# Patient Record
Sex: Male | Born: 1985 | Race: White | Hispanic: No | Marital: Single | Smoking: Never smoker
Health system: Southern US, Community
[De-identification: ages and names within clinical notes are randomized; demographics above are authoritative.]

## PROBLEM LIST (undated history)

## (undated) DIAGNOSIS — F4010 Social phobia, unspecified: Secondary | ICD-10-CM

## (undated) DIAGNOSIS — F4310 Post-traumatic stress disorder, unspecified: Secondary | ICD-10-CM

## (undated) DIAGNOSIS — R030 Elevated blood-pressure reading, without diagnosis of hypertension: Secondary | ICD-10-CM

## (undated) DIAGNOSIS — F410 Panic disorder [episodic paroxysmal anxiety] without agoraphobia: Secondary | ICD-10-CM

## (undated) HISTORY — DX: Panic disorder (episodic paroxysmal anxiety): F41.0

## (undated) HISTORY — DX: Post-traumatic stress disorder, unspecified: F43.10

## (undated) HISTORY — DX: Social phobia, unspecified: F40.10

## (undated) HISTORY — PX: OTHER SURGICAL HISTORY: SHX169

## (undated) HISTORY — DX: Elevated blood-pressure reading, without diagnosis of hypertension: R03.0

---

## 2009-06-15 ENCOUNTER — Emergency Department: Payer: Self-pay | Admitting: Emergency Medicine

## 2009-10-14 ENCOUNTER — Emergency Department: Payer: Self-pay | Admitting: Emergency Medicine

## 2010-07-29 ENCOUNTER — Emergency Department: Payer: Self-pay | Admitting: Emergency Medicine

## 2011-01-20 ENCOUNTER — Emergency Department: Payer: Self-pay | Admitting: Emergency Medicine

## 2011-10-09 IMAGING — CR LEFT LITTLE FINGER 2+V
1 series · 3 of 3 positions shown · non-contrast
Comparison: none

REASON FOR EXAM: trauma, pain, swelling
COMMENTS:   May transport without cardiac monitor

PROCEDURE:     DXR - DXR FINGER PINKY 5TH DIGIT LT HA  - January 20, 2011  [DATE]
RESULT:     There is a fracture of the posterior aspect of the base of the
middle phalanx of the left fifth digit. The fracture extends into the joint
space. No other abnormalities are identified.

[Series 1: view not recorded · 0.17mm/px · 3 of 3 slices shown]
[im 1/3]
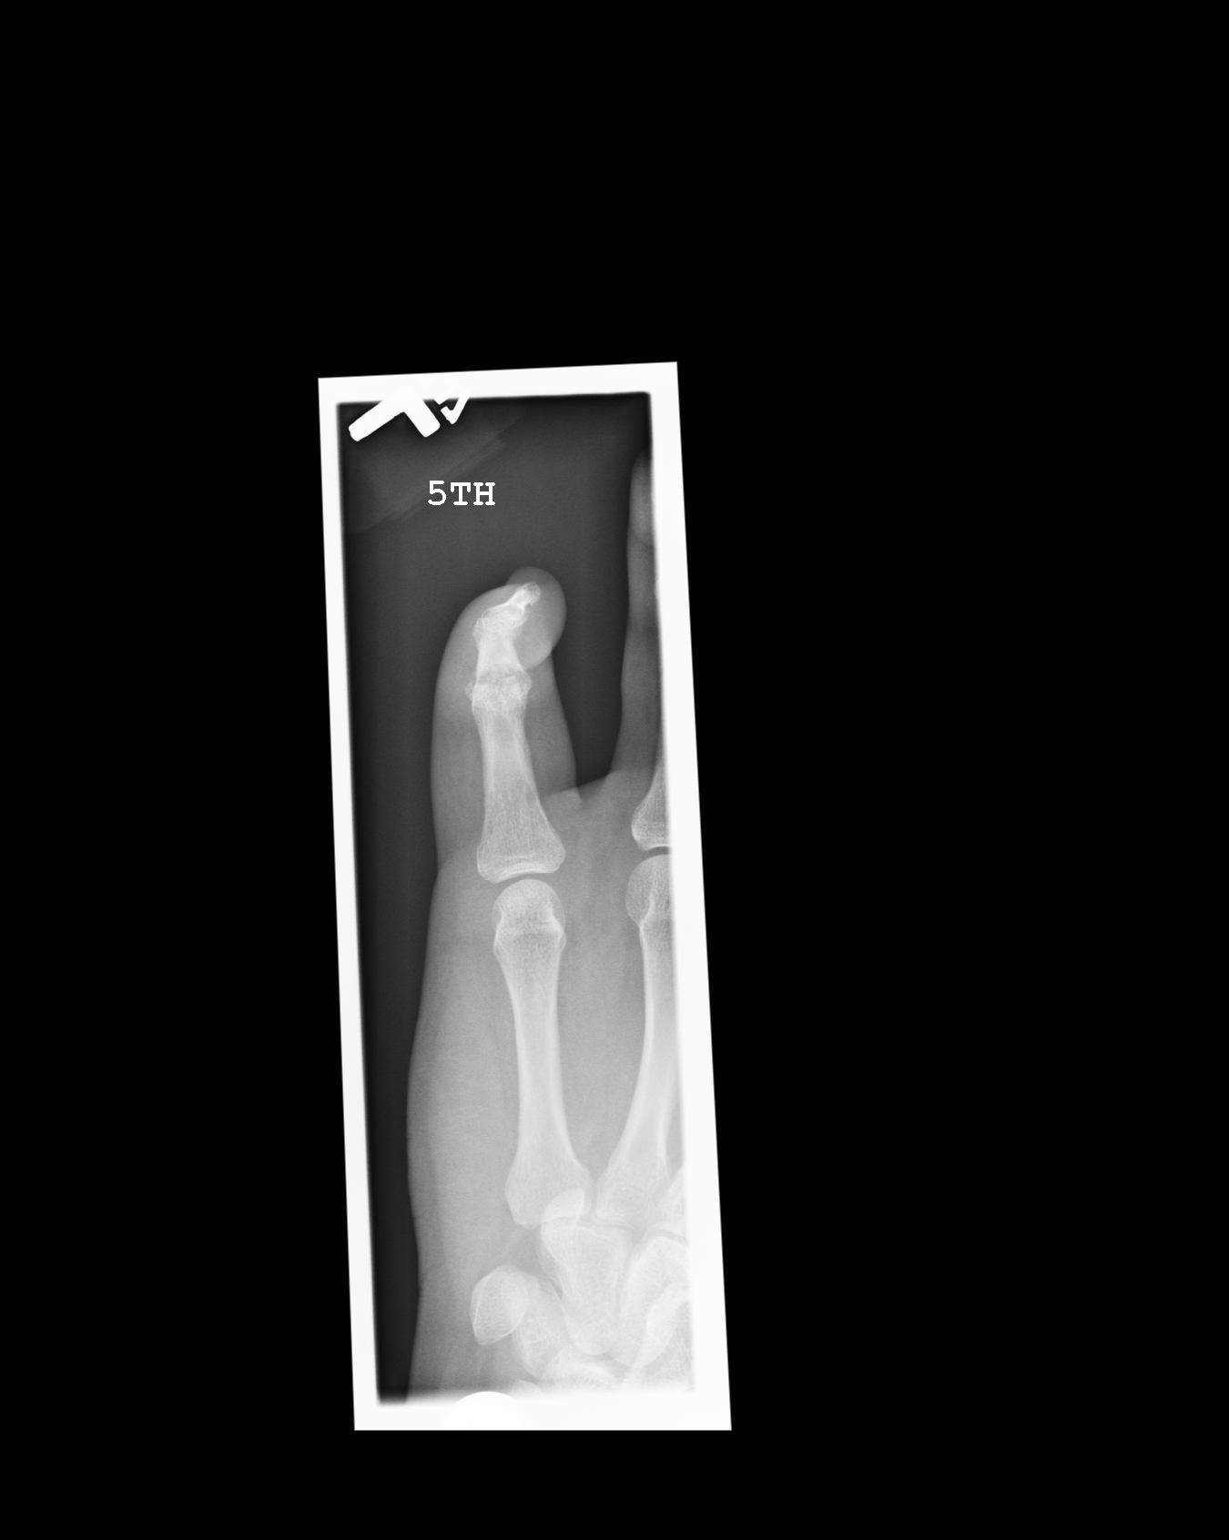
[im 2/3]
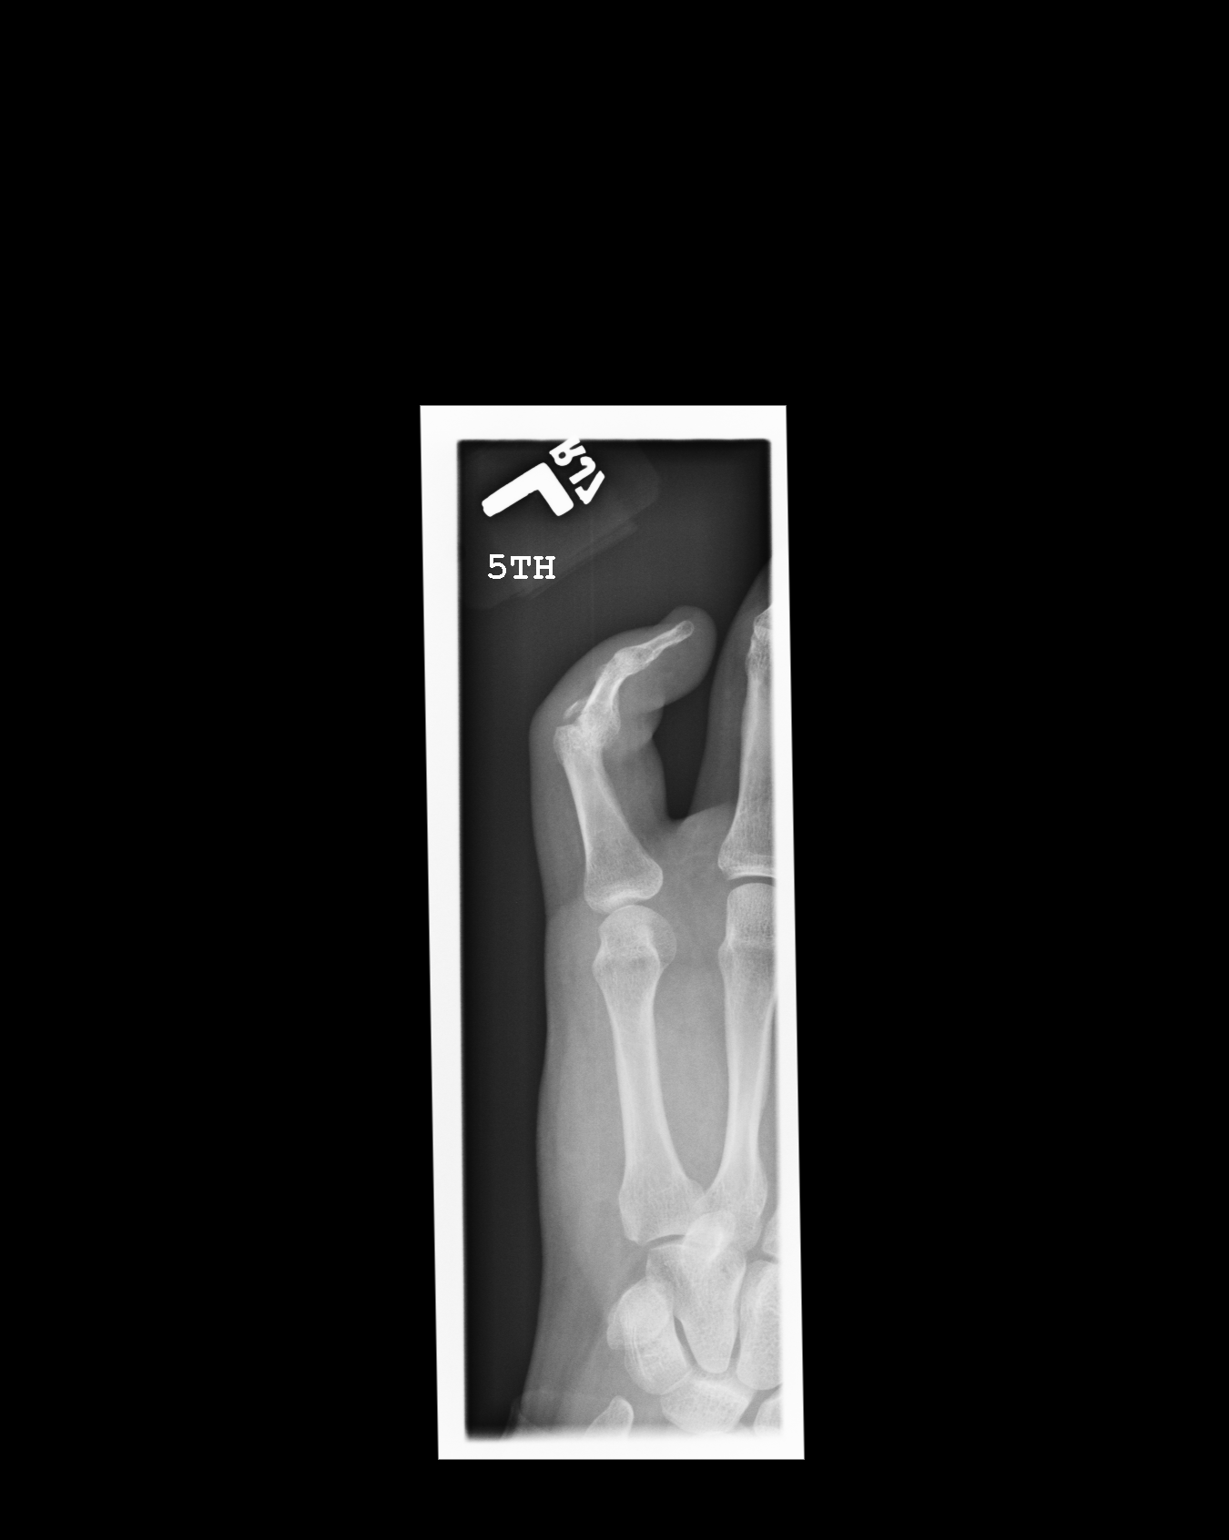
[im 3/3]
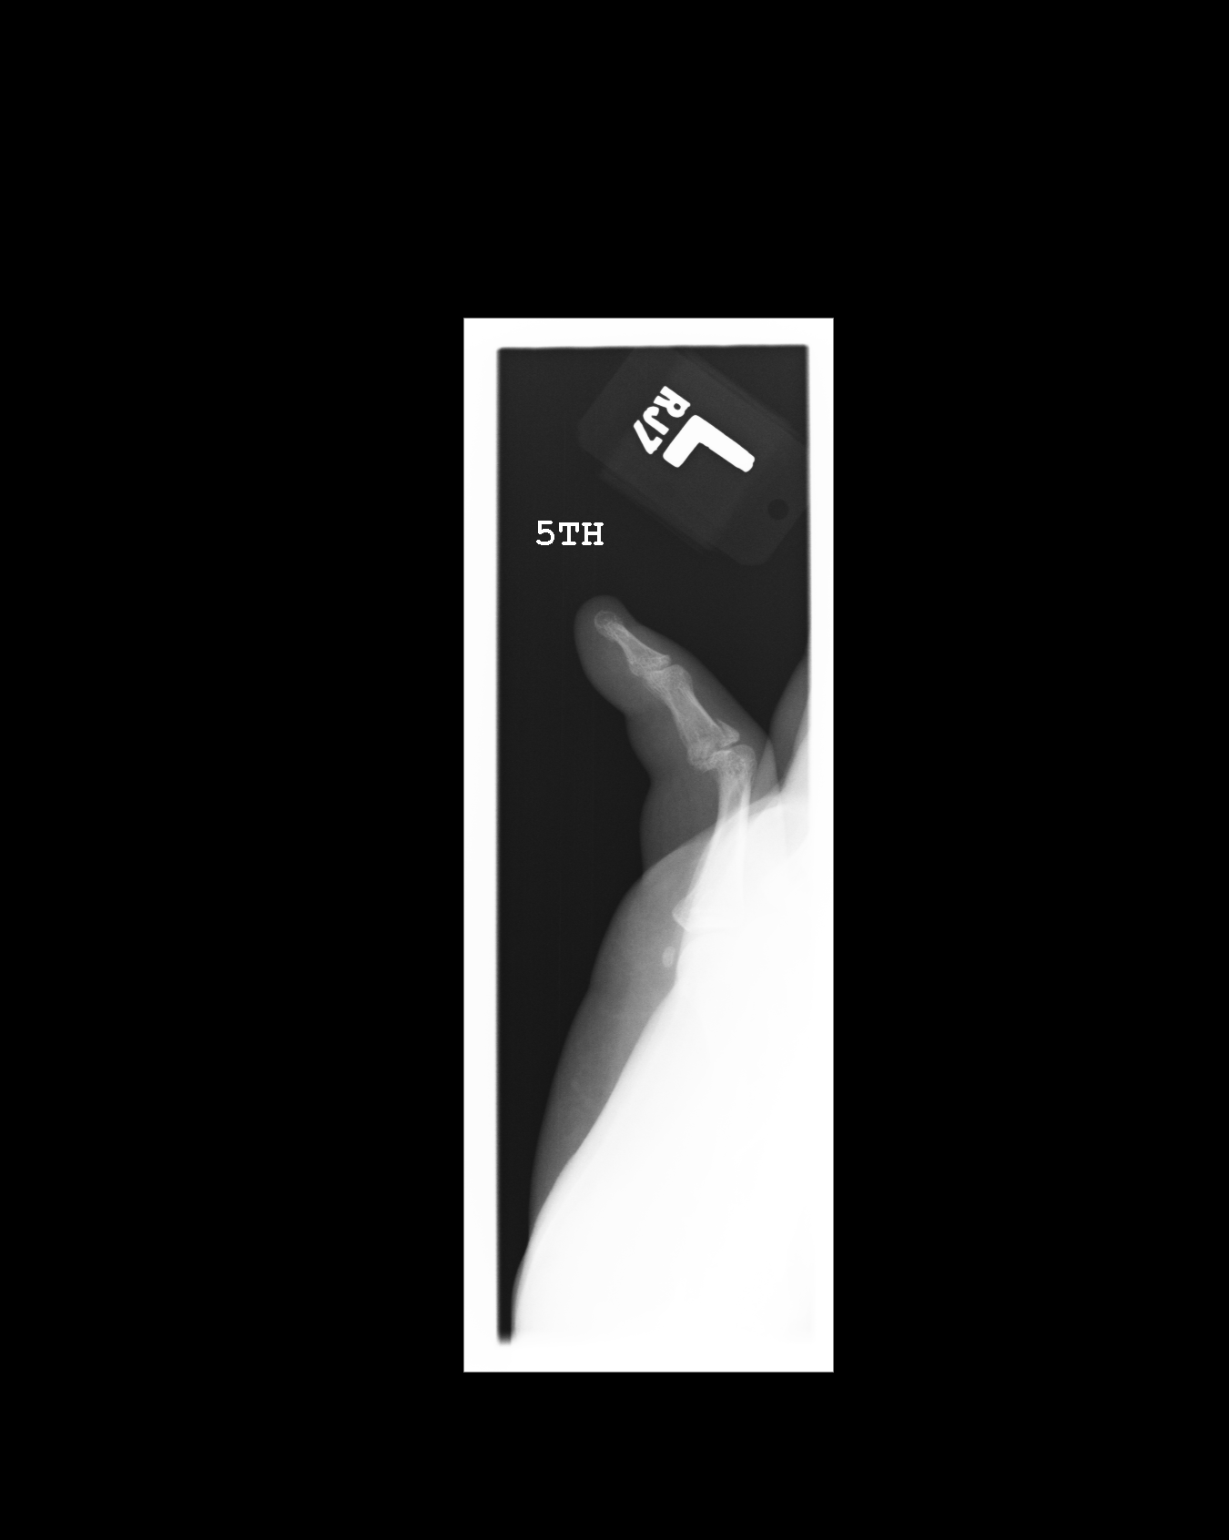

[3 of 3 positions shown; findings below may reference images not displayed]

IMPRESSION: Displaced, angulated fracture along the posterior aspect of
the base of the middle phalanx of the left fifth digit.

## 2014-02-23 ENCOUNTER — Emergency Department: Payer: Self-pay | Admitting: Emergency Medicine

## 2020-06-16 ENCOUNTER — Ambulatory Visit: Payer: Self-pay | Attending: Internal Medicine

## 2020-06-16 DIAGNOSIS — Z23 Encounter for immunization: Secondary | ICD-10-CM

## 2020-06-16 NOTE — Progress Notes (Signed)
° °  Covid-19 Vaccination Clinic  Name:  Craig Boyer    MRN: [id number] DOB: 1986/09/25  06/16/2020  Mr. Craig Boyer was observed post Covid-19 immunization for 15 minutes without incident. He was provided with Vaccine Information Sheet and instruction to access the V-Safe system.   Mr. Craig Boyer was instructed to call 911 with any severe reactions post vaccine:  Difficulty breathing   Swelling of face and throat   A fast heartbeat   A bad rash all over body   Dizziness and weakness   Immunizations Administered    Name Date Dose VIS Date Route   Moderna COVID-19 Vaccine 06/16/2020 11:31 AM 0.5 mL 09/2019 Intramuscular   Manufacturer: Moderna   Lot: [id number]   NDC: 24401-027-25

## 2020-07-07 ENCOUNTER — Ambulatory Visit: Payer: Self-pay

## 2023-10-03 ENCOUNTER — Telehealth: Payer: Self-pay | Admitting: Physician Assistant

## 2023-10-04 ENCOUNTER — Encounter: Payer: Self-pay | Admitting: Physician Assistant

## 2023-10-04 ENCOUNTER — Ambulatory Visit: Payer: 59 | Admitting: Physician Assistant

## 2023-10-04 VITALS — BP 162/101 | HR 115 | Temp 98.4°F | Resp 18 | Ht 78.0 in | Wt 354.4 lb

## 2023-10-04 DIAGNOSIS — I10 Essential (primary) hypertension: Secondary | ICD-10-CM

## 2023-10-04 DIAGNOSIS — F4310 Post-traumatic stress disorder, unspecified: Secondary | ICD-10-CM

## 2023-10-04 DIAGNOSIS — F410 Panic disorder [episodic paroxysmal anxiety] without agoraphobia: Secondary | ICD-10-CM

## 2023-10-04 DIAGNOSIS — Z7689 Persons encountering health services in other specified circumstances: Secondary | ICD-10-CM | POA: Diagnosis not present

## 2023-10-04 DIAGNOSIS — L03115 Cellulitis of right lower limb: Secondary | ICD-10-CM | POA: Diagnosis not present

## 2023-10-04 MED ORDER — VALSARTAN 40 MG PO TABS: 40.0000 mg | ORAL_TABLET | Freq: Every day | ORAL | 3 refills | Status: AC

## 2023-10-04 MED ORDER — CEPHALEXIN 500 MG PO CAPS: 500.0000 mg | ORAL_CAPSULE | Freq: Two times a day (BID) | ORAL | 0 refills | Status: DC

## 2023-10-04 NOTE — Progress Notes (Unsigned)
New patient visit  Patient: Craig Boyer   DOB: [birth year]   37 y.o. Male  MRN: [id number] Visit Date: 10/04/2023  Today's healthcare provider: Debera Lat, PA-C   Chief Complaint  Patient presents with   Establish Care    See's Psych   Cellulitis    Right lower leg.  Was treated w/ antibiotic 2 months, was cleared but has returned   Subjective    Craig Boyer is a 37 y.o. male who presents today as a new patient to establish care.  HPI HPI     Establish Care    Additional comments: See's Psych        Cellulitis    Additional comments: Right lower leg.  Was treated w/ antibiotic 2 months, was cleared but has returned      Last edited by Marcos Eke, CMA on 10/04/2023  2:43 PM.      *** Discussed the use of AI scribe software for clinical note transcription with the patient, who gave verbal consent to proceed.  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 Please review them in the "History" navigator section and refresh this SmartLink. Family Status  Relation Name Status   Mother  Alive   Father  Deceased   Sister 1 Alive  No partnership data on file   Family History  Problem Relation Age of Onset   Throat cancer Father    Cancer Sister    Social History   Socioeconomic History   Marital status: Single    Spouse name: Not on file   Number of children: Not on file   Years of education: Not on file   Highest education level: Not on file  Occupational History   Not on file  Tobacco Use   Smoking status: Never   Smokeless tobacco: Never  Vaping Use   Vaping status: Never Used  Substance and Sexual Activity   Alcohol use: Yes    Comment: rare   Drug use: Never   Sexual activity: Yes  Other Topics Concern   Not on file  Social History  Narrative   Not on file   Social Drivers of Health   Financial Resource Strain: High Risk (02/24/2018)   Received from [facility]   Overall Financial Resource Strain (CARDIA)    Difficulty of Paying Living Expenses: Very hard  Food Insecurity: Food Insecurity Present (02/24/2018)   Received from [facility]   Hunger Vital Sign    Worried About Running Out of Food in the Last Year: Often true    Ran Out of Food in the Last Year: Often true  Transportation Needs: No Transportation Needs (02/24/2018)   Received from [facility]   PRAPARE - Transportation    Lack of Transportation (Medical): No    Lack of Transportation (Non-Medical): No  Physical Activity: Insufficiently Active (02/24/2018)   Received from [facility]   Exercise Vital Sign    Days of Exercise per Week: 1 day    Minutes of Exercise per Session: 60 min  Stress: No Stress Concern Present (02/24/2018)   Received from [facility] - Occupational Stress Questionnaire  Feeling of Stress : Not at all  Social Connections: Somewhat Isolated (02/24/2018)   Received from [facility]   Social Connection and Isolation Panel [NHANES]    Frequency of Communication with Friends and Family: More than three times a week    Frequency of Social Gatherings with Friends and Family: More than three times a week    Attends Religious Services: 1 to 4 times per year    Active Member of [employer] or Organizations: No    Attends Banker Meetings: Never    Marital Status: Divorced   Outpatient Medications Prior to Visit  Medication Sig   methadone (DOLOPHINE) 10 MG tablet Take 60 mg by mouth every 8 (eight) hours.   alprazolam (XANAX) 2 MG tablet Take 2 mg by mouth 2 (two) times daily as needed.   No facility-administered medications prior to visit.   No Known Allergies  Immunization History  Administered Date(s) Administered   Moderna Sars-Covid-2 Vaccination  06/16/2020    Health Maintenance  Topic Date Due   HIV Screening  Never done   Hepatitis C Screening  Never done   DTaP/Tdap/Td (1 - Tdap) Never done   INFLUENZA VACCINE  Never done   COVID-19 Vaccine (2 - 2024-25 season) 06/19/2023   Pneumococcal Vaccine 1-50 Years old  Aged Out   HPV VACCINES  Aged Out    Patient Care Team: Debera Lat, PA-C as PCP - General (Physician Assistant)  Review of Systems Except see HPI   {Insert previous labs (optional):23779} {See past labs  Heme  Chem  Endocrine  Serology  Results Review (optional):1}   Objective    BP (!) 162/101   Pulse (!) 115   Temp 98.4 F (36.9 C)   Resp 18   Ht 6\' 6"  (1.981 m)   Wt (!) 354 lb 6.4 oz (160.8 kg)   SpO2 97%   BMI 40.96 kg/m  {Insert last BP/Wt (optional):23777}{See vitals history (optional):1}   Physical Exam  Depression Screen    10/04/2023    2:37 PM  PHQ 2/9 Scores  PHQ - 2 Score 1  PHQ- 9 Score 4   No results found for any visits on 10/04/23.  Assessment & Plan     *** Assessment and Plan              Encounter to establish care Welcomed to our clinic Reviewed past medical hx, social hx, family hx and surgical hx Pt advised to send all vaccination records or screening   Return in about 4 weeks (around 11/01/2023).    The patient was advised to call back or seek an in-person evaluation if the symptoms worsen or if the condition fails to improve as anticipated.  I discussed the assessment and treatment plan with the patient. The patient was provided an opportunity to ask questions and all were answered. The patient agreed with the plan and demonstrated an understanding of the instructions.  I, Debera Lat, PA-C have reviewed all documentation for this visit. The documentation on  10/04/2023   for the exam, diagnosis, procedures, and orders are all accurate and complete.  Debera Lat, [facility], MMS [facility] [phone] (phone) [phone]  (fax)  [facility]

## 2023-10-05 ENCOUNTER — Encounter: Payer: Self-pay | Admitting: Physician Assistant

## 2023-10-05 ENCOUNTER — Ambulatory Visit: Payer: Self-pay

## 2023-10-05 DIAGNOSIS — F4310 Post-traumatic stress disorder, unspecified: Secondary | ICD-10-CM | POA: Insufficient documentation

## 2023-10-05 DIAGNOSIS — I10 Essential (primary) hypertension: Secondary | ICD-10-CM | POA: Insufficient documentation

## 2023-10-05 DIAGNOSIS — F410 Panic disorder [episodic paroxysmal anxiety] without agoraphobia: Secondary | ICD-10-CM | POA: Insufficient documentation

## 2023-10-05 DIAGNOSIS — L03115 Cellulitis of right lower limb: Secondary | ICD-10-CM | POA: Insufficient documentation

## 2023-10-05 LAB — COMPREHENSIVE METABOLIC PANEL
ALT: 13 [IU]/L (ref 0–44)
AST: 37 [IU]/L (ref 0–40)
Albumin: 3.5 g/dL — ABNORMAL LOW (ref 4.1–5.1)
Alkaline Phosphatase: 162 [IU]/L — ABNORMAL HIGH (ref 44–121)
BUN/Creatinine Ratio: 5 — ABNORMAL LOW (ref 9–20)
BUN: 4 mg/dL — ABNORMAL LOW (ref 6–20)
Bilirubin Total: 1 mg/dL (ref 0.0–1.2)
CO2: 25 mmol/L (ref 20–29)
Calcium: 8.5 mg/dL — ABNORMAL LOW (ref 8.7–10.2)
Chloride: 102 mmol/L (ref 96–106)
Creatinine, Ser: 0.85 mg/dL (ref 0.76–1.27)
Globulin, Total: 3.5 g/dL (ref 1.5–4.5)
Glucose: 99 mg/dL (ref 70–99)
Potassium: 3.7 mmol/L (ref 3.5–5.2)
Sodium: 142 mmol/L (ref 134–144)
Total Protein: 7 g/dL (ref 6.0–8.5)
eGFR: 114 mL/min/{1.73_m2} (ref >59)

## 2023-10-05 LAB — CBC WITH DIFFERENTIAL/PLATELET
Basophils Absolute: 0 10*3/uL (ref 0.0–0.2)
Basos: 1 %
EOS (ABSOLUTE): 0.1 10*3/uL (ref 0.0–0.4)
Eos: 2 %
Hematocrit: 42.9 % (ref 37.5–51.0)
Hemoglobin: 14.4 g/dL (ref 13.0–17.7)
Immature Grans (Abs): 0 10*3/uL (ref 0.0–0.1)
Immature Granulocytes: 0 %
Lymphocytes Absolute: 0.8 10*3/uL (ref 0.7–3.1)
Lymphs: 23 %
MCH: 29.5 pg (ref 26.6–33.0)
MCHC: 33.6 g/dL (ref 31.5–35.7)
MCV: 88 fL (ref 79–97)
Monocytes Absolute: 0.4 10*3/uL (ref 0.1–0.9)
Monocytes: 11 %
Neutrophils Absolute: 2.3 10*3/uL (ref 1.4–7.0)
Neutrophils: 63 %
Platelets: 114 10*3/uL — ABNORMAL LOW (ref 150–450)
RBC: 4.88 x10E6/uL (ref 4.14–5.80)
RDW: 13.8 % (ref 11.6–15.4)
WBC: 3.6 10*3/uL (ref 3.4–10.8)

## 2023-10-05 LAB — PROTIME-INR
INR: 1.2 (ref 0.9–1.2)
Prothrombin Time: 13 s — ABNORMAL HIGH (ref 9.1–12.0)

## 2023-10-05 LAB — LIPID PANEL
Chol/HDL Ratio: 2.3 {ratio} (ref 0.0–5.0)
Cholesterol, Total: 149 mg/dL (ref 100–199)
HDL: 65 mg/dL (ref >39)
LDL Chol Calc (NIH): 70 mg/dL (ref 0–99)
Triglycerides: 74 mg/dL (ref 0–149)
VLDL Cholesterol Cal: 14 mg/dL (ref 5–40)

## 2023-10-05 LAB — TSH: TSH: 2.37 u[IU]/mL (ref 0.450–4.500)

## 2023-10-05 LAB — LACTIC ACID, PLASMA: Lactate, Ven: 15.2 mg/dL (ref 4.8–25.7)

## 2023-10-05 LAB — HEMOGLOBIN A1C
Est. average glucose Bld gHb Est-mCnc: 97 mg/dL
Hgb A1c MFr Bld: 5 % (ref 4.8–5.6)

## 2023-10-06 NOTE — Progress Notes (Signed)
Please, let pt know that The described lab values and symptoms could be consistent with cellulitis of the leg, but other diagnoses should also be considered given the abnormal labs. We will recheck you on 10/10/23, please, call and schedule a follow-up appointment

## 2023-10-10 ENCOUNTER — Ambulatory Visit: Payer: 59

## 2023-10-10 ENCOUNTER — Encounter: Payer: Self-pay | Admitting: Physician Assistant

## 2023-10-10 DIAGNOSIS — L03115 Cellulitis of right lower limb: Secondary | ICD-10-CM

## 2023-10-12 MED ORDER — DOXYCYCLINE HYCLATE 100 MG PO TABS: 100.0000 mg | ORAL_TABLET | Freq: Two times a day (BID) | ORAL | 0 refills | Status: DC

## 2023-10-20 ENCOUNTER — Ambulatory Visit: Payer: Self-pay | Admitting: Infectious Diseases

## 2023-11-02 ENCOUNTER — Ambulatory Visit: Payer: 59 | Admitting: Physician Assistant

## 2023-11-16 ENCOUNTER — Ambulatory Visit: Payer: 59 | Admitting: Physician Assistant

## 2023-11-17 ENCOUNTER — Telehealth: Payer: Self-pay | Admitting: Physician Assistant

## 2023-11-17 ENCOUNTER — Ambulatory Visit: Payer: 59 | Admitting: Physician Assistant

## 2023-11-17 NOTE — Telephone Encounter (Signed)
Copied from CRM [id number]. Topic: Appointment Scheduling - Scheduling Inquiry for Clinic >> Nov 17, 2023  9:21 AM Tiffany B wrote: Reason for CRM: Caller states patient PCP is great but theres a language barrier and would like to transfer to another physician within the practice. Please advise and call patient mother directly regarding who her son can establish care with.

## 2023-12-08 ENCOUNTER — Ambulatory Visit: Payer: 59 | Admitting: Nurse Practitioner

## 2023-12-08 ENCOUNTER — Ambulatory Visit: Payer: Self-pay | Admitting: Physician Assistant

## 2023-12-08 NOTE — Telephone Encounter (Signed)
 Chief Complaint: leg swelling Symptoms: R leg swelling from the knee down with redness and oozing Frequency: ongoing for a year Pertinent Negatives: Patient denies fever, CP, SOB, N/V Disposition: [] ED /[] Urgent Care (no appt availability in office) / [x] Appointment(In office/virtual)/ []  [facility]/ [] Home Care/ [] Refused Recommended Disposition /[] [facility]/ []  Follow-up with PCP Additional Notes: Pt's mother Kedric Bumgarner calls in for pt. Ms Meinecke reports pt has had R leg swelling, oozing, and redness for a year and has been diagnosed with cellulitis. Pt was prescribed doxycycline back in December of last year and it was not effective. Pt denies fever, chills, N/V, CP, SOB. Pt is still able to walk "but it's slow." Pt had a new pt office visit scheduled for today with a different office, but had to cancel the visit d/t transportation issue with the weather. Per protocol, RN advised pt should be seen within the next 4 hrs. Pt and Ms Vereen agreeable to be seen at [facility] since they are currently established there. Ms Pesqueira said that the patient can't be seen today because the patient needs transportation that must be pre-arranged. RN called the CAL. CAL said the patient can be booked for sometime next week to allow for transportation to be pre-arranged. RN scheduled pt for this Monday 2/24 at 1500 to allow for transportation and because Ms Adriana Simas asked for an afternoon appt. Ms Adriana Simas agreeable to that plan. RN advised Ms Adriana Simas that if the patient worsens, if he develops a fever, chills, N/V, or CP, SOB, worsening swelling, inability to walk, they need to call 911/go to the ED, to which she verbalized understanding.   Reason for Disposition  [1] Red area or streak [2] large (> 2 in. or 5 cm)  Answer Assessment - Initial Assessment Questions 1. ONSET: "When did the swelling start?" (e.g., minutes, hours, days)     Ongoing for a year 2. LOCATION: "What part of the leg is swollen?"  "Are both  legs swollen or just one leg?"     R leg from the knee down 3. SEVERITY: "How bad is the swelling?" (e.g., localized; mild, moderate, severe)   - Localized: Small area of swelling localized to one leg.   - MILD pedal edema: Swelling limited to foot and ankle, pitting edema < 1/4 inch (6 mm) deep, rest and elevation eliminate most or all swelling.   - MODERATE edema: Swelling of lower leg to knee, pitting edema > 1/4 inch (6 mm) deep, rest and elevation only partially reduce swelling.   - SEVERE edema: Swelling extends above knee, facial or hand swelling present.      Moderate swelling - "slight" pitting, elevation does help with the swelling in the foot, swelling from the knee down 4. REDNESS: "Does the swelling look red or infected?"     Redness and oozing, "rough places on the front of his shin", drainage is clear 5. PAIN: "Is the swelling painful to touch?" If Yes, ask: "How painful is it?"   (Scale 1-10; mild, moderate or severe)     Painful to the touch at times - still able to walk, "but it's slow", 3/10 pain with walking 6. FEVER: "Do you have a fever?" If Yes, ask: "What is it, how was it measured, and when did it start?"      None 7. CAUSE: "What do you think is causing the leg swelling?"     Hx of ongoing cellulitis 8. MEDICAL HISTORY: "Do you have a history of  blood clots (e.g., DVT), cancer, heart failure, kidney disease, or liver failure?"     HTN 9. RECURRENT SYMPTOM: "Have you had leg swelling before?" If Yes, ask: "When was the last time?" "What happened that time?"     Doxycycline at the end of December wasn't helpful 10. OTHER SYMPTOMS: "Do you have any other symptoms?" (e.g., chest pain, difficulty breathing)       Swelling, oozing, redness is constant  Protocols used: Leg Swelling and Edema-A-AH

## 2023-12-12 ENCOUNTER — Ambulatory Visit: Payer: 59 | Admitting: Family Medicine

## 2023-12-26 ENCOUNTER — Ambulatory Visit: Payer: 59 | Admitting: Family Medicine

## 2023-12-27 ENCOUNTER — Ambulatory Visit: Admitting: Physician Assistant

## 2023-12-29 ENCOUNTER — Encounter: Payer: MEDICAID | Attending: Physician Assistant | Admitting: Physician Assistant

## 2023-12-29 DIAGNOSIS — G894 Chronic pain syndrome: Secondary | ICD-10-CM | POA: Insufficient documentation

## 2023-12-29 DIAGNOSIS — I87323 Chronic venous hypertension (idiopathic) with inflammation of bilateral lower extremity: Secondary | ICD-10-CM | POA: Insufficient documentation

## 2023-12-29 DIAGNOSIS — I10 Essential (primary) hypertension: Secondary | ICD-10-CM | POA: Diagnosis not present

## 2023-12-29 DIAGNOSIS — I890 Lymphedema, not elsewhere classified: Secondary | ICD-10-CM | POA: Diagnosis not present

## 2024-01-04 ENCOUNTER — Ambulatory Visit: Admitting: Family Medicine

## 2024-01-04 ENCOUNTER — Telehealth: Payer: Self-pay

## 2024-01-04 ENCOUNTER — Telehealth: Payer: Self-pay | Admitting: Physician Assistant

## 2024-01-04 NOTE — Telephone Encounter (Signed)
 Copied from CRM [id number]. Topic: Clinical - Medication Question >> Jan 04, 2024  8:50 AM Elle L wrote: Reason for CRM: The patient is requesting a blood pressure medication that is safe to take with methadone (DOLOPHINE) 10 MG tablet. He is currently taking valsartan (DIOVAN) 40 MG tablet. The patient's call back number is [phone].

## 2024-01-09 ENCOUNTER — Encounter: Payer: MEDICAID | Admitting: Physician Assistant

## 2024-01-09 DIAGNOSIS — I87323 Chronic venous hypertension (idiopathic) with inflammation of bilateral lower extremity: Secondary | ICD-10-CM | POA: Diagnosis not present

## 2024-01-16 ENCOUNTER — Ambulatory Visit: Admitting: Internal Medicine

## 2024-01-19 ENCOUNTER — Other Ambulatory Visit (INDEPENDENT_AMBULATORY_CARE_PROVIDER_SITE_OTHER): Payer: Self-pay | Admitting: Physician Assistant

## 2024-01-19 DIAGNOSIS — I890 Lymphedema, not elsewhere classified: Secondary | ICD-10-CM

## 2024-01-19 DIAGNOSIS — R0989 Other specified symptoms and signs involving the circulatory and respiratory systems: Secondary | ICD-10-CM

## 2024-01-20 ENCOUNTER — Encounter (INDEPENDENT_AMBULATORY_CARE_PROVIDER_SITE_OTHER): Payer: Self-pay

## 2024-01-23 ENCOUNTER — Ambulatory Visit: Admitting: Physician Assistant

## 2024-01-30 ENCOUNTER — Ambulatory Visit: Payer: MEDICAID | Admitting: Physician Assistant

## 2024-02-01 ENCOUNTER — Encounter (INDEPENDENT_AMBULATORY_CARE_PROVIDER_SITE_OTHER): Payer: Self-pay

## 2024-02-02 ENCOUNTER — Ambulatory Visit: Payer: 59 | Admitting: Nurse Practitioner

## 2024-02-02 NOTE — Progress Notes (Deleted)
 There were no vitals taken for this visit.   Subjective:    Patient ID: Craig Boyer, male    DOB: [birth year], 38 y.o.   MRN: [id number]  HPI: Craig Boyer is a 38 y.o. male  No chief complaint on file.  Patient presents to clinic to establish care with new PCP.  Introduced to Publishing rights manager role and practice setting.  All questions answered.  Discussed provider/patient relationship and expectations.  Patient reports a history of ***. Patient denies a history of: Hypertension, Elevated Cholesterol, Diabetes, Thyroid problems, Depression, Anxiety, Neurological problems, and Abdominal problems.   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 Please review them in the "History" navigator section and refresh this SmartLink. Family History  Problem Relation Age of Onset   Throat cancer Father    Cancer Sister      Review of Systems  Per HPI unless specifically indicated above     Objective:    There were no vitals taken for this visit.  Wt Readings from Last 3 Encounters:  10/04/23 (!) 354 lb 6.4 oz (160.8 kg)    Physical Exam  Results for orders placed or performed in visit on 10/04/23  CBC with Differential/Platelet   Collection Time: 10/04/23  3:37 PM  Result Value Ref Range   WBC 3.6 3.4 - 10.8 x10E3/uL   RBC 4.88 4.14 - 5.80 x10E6/uL   Hemoglobin 14.4 13.0 - 17.7 g/dL   Hematocrit 40.9 81.1 - 51.0 %   MCV 88 79 - 97 fL   MCH 29.5 26.6 - 33.0 pg   MCHC 33.6 31.5 - 35.7 g/dL   RDW 91.4 78.2 - 95.6 %   Platelets 114 (L) 150 - 450 x10E3/uL    Neutrophils 63 Not Estab. %   Lymphs 23 Not Estab. %   Monocytes 11 Not Estab. %   Eos 2 Not Estab. %   Basos 1 Not Estab. %   Neutrophils Absolute 2.3 1.4 - 7.0 x10E3/uL   Lymphocytes Absolute 0.8 0.7 - 3.1 x10E3/uL   Monocytes Absolute 0.4 0.1 - 0.9 x10E3/uL   EOS (ABSOLUTE) 0.1 0.0 - 0.4 x10E3/uL   Basophils Absolute 0.0 0.0 - 0.2 x10E3/uL   Immature Granulocytes 0 Not Estab. %   Immature Grans (Abs) 0.0 0.0 - 0.1 x10E3/uL  Comprehensive metabolic panel   Collection Time: 10/04/23  3:37 PM  Result Value Ref Range   Glucose 99 70 - 99 mg/dL   BUN 4 (L) 6 - 20 mg/dL   Creatinine, Ser 2.13 0.76 - 1.27 mg/dL   eGFR 086 >57 QI/ONG/2.95   BUN/Creatinine Ratio 5 (L) 9 - 20   Sodium 142 134 - 144 mmol/L   Potassium 3.7 3.5 - 5.2 mmol/L   Chloride 102 96 - 106 mmol/L   CO2 25 20 - 29 mmol/L   Calcium 8.5 (L) 8.7 - 10.2 mg/dL   Total Protein 7.0 6.0 - 8.5 g/dL   Albumin 3.5 (L) 4.1 - 5.1 g/dL   Globulin, Total 3.5 1.5 - 4.5 g/dL   Bilirubin Total 1.0  0.0 - 1.2 mg/dL   Alkaline Phosphatase 162 (H) 44 - 121 IU/L   AST 37 0 - 40 IU/L   ALT 13 0 - 44 IU/L  Hemoglobin A1c   Collection Time: 10/04/23  3:37 PM  Result Value Ref Range   Hgb A1c MFr Bld 5.0 4.8 - 5.6 %   Est. average glucose Bld gHb Est-mCnc 97 mg/dL  Lipid panel   Collection Time: 10/04/23  3:37 PM  Result Value Ref Range   Cholesterol, Total 149 100 - 199 mg/dL   Triglycerides 74 0 - 149 mg/dL   HDL 65 >16 mg/dL   VLDL Cholesterol Cal 14 5 - 40 mg/dL   LDL Chol Calc (NIH) 70 0 - 99 mg/dL   Chol/HDL Ratio 2.3 0.0 - 5.0 ratio  TSH   Collection Time: 10/04/23  3:37 PM  Result Value Ref Range   TSH 2.370 0.450 - 4.500 uIU/mL  INR/PT   Collection Time: 10/04/23  3:37 PM  Result Value Ref Range   INR 1.2 0.9 - 1.2   Prothrombin Time 13.0 (H) 9.1 - 12.0 sec  Lactic acid, plasma   Collection Time: 10/04/23  3:37 PM  Result Value Ref Range   Lactate, Ven 15.2 4.8 - 25.7 mg/dL      Assessment & Plan:   Problem  List Items Addressed This Visit   None    Follow up plan: No follow-ups on file.

## 2024-02-07 NOTE — Telephone Encounter (Signed)
 Craig Boyer

## 2024-02-14 ENCOUNTER — Encounter (INDEPENDENT_AMBULATORY_CARE_PROVIDER_SITE_OTHER): Payer: Self-pay | Admitting: Nurse Practitioner

## 2024-03-06 ENCOUNTER — Encounter (INDEPENDENT_AMBULATORY_CARE_PROVIDER_SITE_OTHER): Payer: Self-pay

## 2024-03-21 ENCOUNTER — Encounter (INDEPENDENT_AMBULATORY_CARE_PROVIDER_SITE_OTHER): Payer: MEDICAID

## 2024-03-29 ENCOUNTER — Telehealth: Payer: Self-pay

## 2024-03-29 NOTE — Telephone Encounter (Signed)
 Copied from CRM [id number]. Topic: Clinical - Medication Question >> Mar 29, 2024  9:22 AM Donald Frost wrote: Reason for CRM: Stana Ear, the mother of the patient called stating the mental health specialist her son was seeing closed down his practice and so she is worried because he was prescribing her son Xanax which he has been on for 20 years. She wonders if this is something his PCP could keep up with. He saw her 1 time back in December. Please assist patient further as he takes 2mg  twice daily and she is worried about him. She says he has about a week left of the medication

## 2024-03-29 NOTE — Telephone Encounter (Signed)
 Spoke with pt and was made aware. Pt verbalized understanding

## 2024-03-30 ENCOUNTER — Other Ambulatory Visit: Payer: Self-pay

## 2024-04-04 ENCOUNTER — Encounter (INDEPENDENT_AMBULATORY_CARE_PROVIDER_SITE_OTHER): Payer: MEDICAID

## 2024-04-09 ENCOUNTER — Other Ambulatory Visit: Payer: Self-pay | Admitting: Physician Assistant

## 2024-04-09 NOTE — Telephone Encounter (Unsigned)
 Copied from CRM [id number]. Topic: Clinical - Prescription Issue >> Apr 09, 2024  5:44 PM Tiffini S wrote: Reason for CRM: Patient mother called stating that the patient out of medication for alprazolam (XANAX) 2 MG tablet. Sates that the patient is getting anxious and she needs a update on when the medication will be refilled. Please call back at  [phone].

## 2024-04-09 NOTE — Telephone Encounter (Unsigned)
 Copied from CRM [id number]. Topic: Clinical - Medication Refill >> Apr 09, 2024  9:15 AM Franky GRADE wrote: Medication: alprazolam (XANAX) 2 MG tablet [[id number]]  Has the patient contacted their pharmacy? No (Agent: If no, request that the patient contact the pharmacy for the refill. If patient does not wish to contact the pharmacy document the reason why and proceed with request.) (Agent: If yes, when and what did the pharmacy advise?)  This is the patient's preferred pharmacy:  [facility] - [place], [place] - [address] [place] [address] Phone: [phone] Fax: [phone]  Is this the correct pharmacy for this prescription? Yes If no, delete pharmacy and type the correct one.   Has the prescription been filled recently? No  Is the patient out of the medication? Yes  Has the patient been seen for an appointment in the last year OR does the patient have an upcoming appointment? Yes  Can we respond through MyChart? Yes  Agent: Please be advised that Rx refills may take up to 3 business days. We ask that you follow-up with your pharmacy.

## 2024-04-10 NOTE — Telephone Encounter (Signed)
 Requested medication (s) are due for refill today: historical medication   Requested medication (s) are on the active medication list: yes   Last refill:  na   Future visit scheduled: yes 07/02/24  Notes to clinic:  not delegated per protocol do you want to order Rx? Historical medi cation      Requested Prescriptions  Pending Prescriptions Disp Refills   alprazolam (XANAX) 2 MG tablet 30 tablet     Sig: Take 1 tablet (2 mg total) by mouth 2 (two) times daily as needed.     Not Delegated - Psychiatry: Anxiolytics/Hypnotics 2 Failed - 04/10/2024  4:29 PM      Failed - This refill cannot be delegated      Failed - Urine Drug Screen completed in last 360 days      Failed - Valid encounter within last 6 months    Recent Outpatient Visits   None            Passed - Patient is not pregnant

## 2024-04-11 ENCOUNTER — Encounter (INDEPENDENT_AMBULATORY_CARE_PROVIDER_SITE_OTHER): Payer: MEDICAID

## 2024-04-11 NOTE — Telephone Encounter (Signed)
 Copied from CRM [id number]. Topic: Clinical - Prescription Issue >> Apr 11, 2024  9:21 AM Wess RAMAN wrote: Reason for CRM: Patient's mother Ellouise would like to know when alprazolam (XANAX) 2 MG tablet will be refilled. Advised her that it is currently being worked on and it takes up to 3 business days for prescription refills. She also stated patient is out of medication.  Callback #: [phone] (Tina-mother) or [phone] (son)

## 2024-04-12 ENCOUNTER — Ambulatory Visit: Payer: Self-pay | Admitting: *Deleted

## 2024-04-12 ENCOUNTER — Other Ambulatory Visit: Payer: Self-pay

## 2024-04-12 DIAGNOSIS — F410 Panic disorder [episodic paroxysmal anxiety] without agoraphobia: Secondary | ICD-10-CM

## 2024-04-12 DIAGNOSIS — F99 Mental disorder, not otherwise specified: Secondary | ICD-10-CM

## 2024-04-12 DIAGNOSIS — F4310 Post-traumatic stress disorder, unspecified: Secondary | ICD-10-CM

## 2024-04-12 MED ORDER — ALPRAZOLAM 2 MG PO TABS: 2.0000 mg | ORAL_TABLET | Freq: Two times a day (BID) | ORAL | 0 refills | Status: AC | PRN

## 2024-04-12 NOTE — Telephone Encounter (Signed)
 After obtaining verbal consent from patient he confirmed it was ok for me to speak with his mother, Craig Boyer.  Mother is concerned because patient has only 1 Xanax left and has been very anxious.  Mother stated that she was told by CMA that Janna had agreed that a courtesy refill could be sent in.  I advised her that in none of the messages did it say Janna agreed to prescribe the medication.  I also advised her that patient had released Janna as his PCP and made several appointments with other providers, some in other offices as well and had not established with anyone yet.  Rock advised me that they have transportation difficulties and for them to get transportation they have to give 2 days notice.  Since today is Thursday it would be difficult for the to get seen in most offices.  I advised her that I would speak with Janna and see if a courtesy refill for 1 week could be arranged so they could get patient seen at [facility].  She is aware this is not a guarantee a prescription can be filled but I would check.   It is also of note that when they were told on 03/30/24 that he needed to see a PCP the CMA made him an appointment with Janna.  This will probably need to be rescheduled

## 2024-04-12 NOTE — Telephone Encounter (Signed)
 Patient called back about his prescription request. Triage information was already sent to office. Patient given information about [facility] 24 hour [facility]gent [facility] located at [address] in [place]. Patient states he doesn't have anything to write with and states he is going to ask his mother to call back about information. Patient is highly encouraged to keep his next appointment with PCP.

## 2024-04-12 NOTE — Telephone Encounter (Signed)
   FYI Only or Action Required?: Action required by provider: medication refill request.  Patient was last seen in primary care on 10/04/23. Called Nurse Triage reporting Medication Refill. Symptoms began several days ago. Interventions attempted: Nothing. Symptoms are: gradually worsening.  Triage Disposition: Call PCP When Office is Open  Patient/caregiver understands and will follow disposition?:Not sure patient/mother understands - refill denied- unable to release that information- could not find ROI in chart- mother will have patient call back    Reason for Disposition  Caller requesting a CONTROLLED substance prescription refill (e.g., narcotics, ADHD medicines)  Answer Assessment - Initial Assessment Questions 1. DRUG NAME: What medicine do you need to have refilled?     alprazolam (XANAX) 2 MG tablet  2. REFILLS REMAINING: How many refills are remaining? (Note: The label on the medicine or pill bottle will show how many refills are remaining. If there are no refills remaining, then a renewal may be needed.)     none 3. EXPIRATION DATE: What is the expiration date? (Note: The label states when the prescription will expire, and thus can no longer be refilled.)     na 4. PRESCRIBING HCP: Who prescribed it? Reason: If prescribed by specialist, call should be referred to that group.     Outside provider 5. SYMPTOMS: Do you have any symptoms?     Anxiety increase  Call from patient's mother( no DPR found in chart). She is concerned that patient is out of medication and states she understood PCP would be filling Rx until patient can establish with another psychiatry provider(provider recently left practice). Explained unable to speak to her regarding patient medical information- advised [facility] for immediate needs- have patient call for more information- looks like medication refill has been denied by provider- unable to relay that information.  Protocols used: Medication Refill  and Renewal Call-A-AH     Copied from CRM [id number]. Topic: Clinical - Red Word Triage >> Apr 12, 2024  9:21 AM Elle L wrote: Red Word that prompted transfer to Nurse Triage: The patient's Mother was calling for an update on the patient's alprazolam (XANAX) 2 MG tablet refill. However, she advised me that the patient has been experiencing severe anxiety and is out of the medication

## 2024-04-12 NOTE — Telephone Encounter (Signed)
 A courtesy refill before his initial [facility] appointment.  Also, Advised to establish care at the same [facility] clinic with a different provider and continue to check for refills with them.  Please, remember to reschedule with a new provider as pt wants to be seen by a different provider

## 2024-04-12 NOTE — Telephone Encounter (Signed)
 I spoke with Rock and advised that Janna has agreed to prescribe 1 week of Xanax for Eual with the understanding that Saifullah is to go to [facility] and be seen within the next week.  He is to also get set up with psychiatry and another PCP.  Advised her that the appointment made with Janna in August will get cancelled.  She is going to see if [facility] can arrange for him to see a psychiatrist for chronic care since Dr Daniel is no longer practicing.

## 2024-04-16 NOTE — Telephone Encounter (Signed)
 Copied from CRM [id number]. Topic: Clinical - Medication Question >> Apr 16, 2024  9:23 AM Tiffany B wrote: Reason for CRM: Caller states Dr. Conny Su psychiatrist is no longer at the clinic and PCP is declining to provide any further Xanax  refills therefore caller was seeking a TOC appointment with the hopes of the new provider to prescribe.   Caller concern is patient has a 7 day supply left and unsure where patient would get further refills until his [facility] appointment scheduled for 05/25/2024. Please call patient back directly.

## 2024-04-17 ENCOUNTER — Ambulatory Visit: Payer: Self-pay

## 2024-04-17 NOTE — Telephone Encounter (Signed)
 EC2 was calling patient from an old message that had already been handled.

## 2024-04-17 NOTE — Telephone Encounter (Signed)
 No triage- mixed up from last week message.        Copied from CRM [id number]. Topic: Clinical - Pink Word Triage >> Apr 17, 2024  8:49 AM Powell HERO wrote: Reason for Triage: Patient is out of his medication alprazolam  (XANAX ) 2 MG tablet. No one will refill. Has been on for over 20 years. Seeking advice on how to get filled. >> Apr 17, 2024  8:51 AM Powell HERO wrote: Patient is out of his medication alprazolam  (XANAX ) 2 MG tablet. No one will refill. Has been on for over 20 years. Seeking advice on how to get filled. Number [phone]

## 2024-04-17 NOTE — Telephone Encounter (Signed)
 3rd attempt. No answer, left a voicemail for patient to return call for nurse triage. Routing to PCP office ([facility]) as patient has not had his TOC appointment to [facility] yet. Please see 04/12/24 refill and nurse triage encounters regarding the same medication refill request. Reason for Disposition . Third attempt to contact caller AND no contact made. Phone number verified.  Protocols used: No Contact or Duplicate Contact Call-A-AH

## 2024-04-19 ENCOUNTER — Other Ambulatory Visit: Payer: Self-pay | Admitting: Physician Assistant

## 2024-04-19 DIAGNOSIS — F4310 Post-traumatic stress disorder, unspecified: Secondary | ICD-10-CM

## 2024-04-19 DIAGNOSIS — F410 Panic disorder [episodic paroxysmal anxiety] without agoraphobia: Secondary | ICD-10-CM

## 2024-04-19 DIAGNOSIS — F99 Mental disorder, not otherwise specified: Secondary | ICD-10-CM

## 2024-04-19 NOTE — Telephone Encounter (Signed)
 Faxed confirmation. Called and informed patient it was sent. Patient verbalized understanding.

## 2024-04-19 NOTE — Telephone Encounter (Signed)
 Copied from CRM [id number]. Topic: General - Other >> Apr 18, 2024  3:52 PM Wess RAMAN wrote: Reason for CRM: Patient's mother, Craig Boyer, would like the appointment confirmation for 8/8 faxed to [facility].  Fax #: [phone] Phone #: [phone] >> Apr 18, 2024  4:10 PM Antwanette L wrote: The patient mother is calling because she needs us  to send over an appt confirmation (05/25/24 with Craig Boyer) to [facility] ) on [address] [place], [place]. CBC phone number is [phone] and fax is [phone]

## 2024-04-23 NOTE — Telephone Encounter (Signed)
 Per our agreement , I sent a courtesy refill for pt before he will see [facility]

## 2024-05-23 ENCOUNTER — Ambulatory Visit: Payer: MEDICAID | Admitting: Physician Assistant

## 2024-05-24 ENCOUNTER — Ambulatory Visit: Payer: MEDICAID | Admitting: Physician Assistant

## 2024-05-25 ENCOUNTER — Encounter: Payer: MEDICAID | Admitting: Family Medicine

## 2024-06-04 ENCOUNTER — Ambulatory Visit: Payer: MEDICAID | Admitting: Nurse Practitioner

## 2024-06-04 NOTE — Progress Notes (Deleted)
 There were no vitals taken for this visit.   Subjective:    Patient ID: JERIMAH WITUCKI, male    DOB: 1986/06/08, 38 y.o.   MRN: [id number]  HPI: ARIEH BOGUE is a 38 y.o. male  No chief complaint on file.   Discussed the use of AI scribe software for clinical note transcription with the patient, who gave verbal consent to proceed.  History of Present Illness          10/04/2023    2:37 PM  Depression screen PHQ 2/9  Decreased Interest 0  Down, Depressed, Hopeless 1  PHQ - 2 Score 1  Altered sleeping 1  Tired, decreased energy 1  Change in appetite 1  Feeling bad or failure about yourself  0  Trouble concentrating 0  Moving slowly or fidgety/restless 0  Suicidal thoughts 0  PHQ-9 Score 4  Difficult doing work/chores Not difficult at all    Relevant past medical, surgical, family and social history reviewed and updated as indicated. Interim medical history since our last visit reviewed. Allergies and medications reviewed and updated.  Review of Systems  Per HPI unless specifically indicated above     Objective:     There were no vitals taken for this visit.  {Vitals History (Optional):23777} Wt Readings from Last 3 Encounters:  10/04/23 (!) 354 lb 6.4 oz (160.8 kg)    Physical Exam Physical Exam    Results for orders placed or performed in visit on 10/04/23  CBC with Differential/Platelet   Collection Time: 10/04/23  3:37 PM  Result Value Ref Range   WBC 3.6 3.4 - 10.8 x10E3/uL   RBC 4.88 4.14 - 5.80 x10E6/uL   Hemoglobin 14.4 13.0 - 17.7 g/dL   Hematocrit 57.0 62.4 - 51.0 %   MCV 88 79 - 97 fL   MCH 29.5 26.6 - 33.0 pg   MCHC 33.6 31.5 - 35.7 g/dL   RDW 86.1 88.3 - 84.5 %   Platelets 114 (L) 150 - 450 x10E3/uL   Neutrophils 63 Not Estab. %   Lymphs 23 Not Estab. %   Monocytes 11 Not Estab. %   Eos 2 Not Estab. %   Basos 1 Not Estab. %   Neutrophils Absolute 2.3 1.4 - 7.0 x10E3/uL   Lymphocytes Absolute 0.8 0.7 - 3.1 x10E3/uL   Monocytes  Absolute 0.4 0.1 - 0.9 x10E3/uL   EOS (ABSOLUTE) 0.1 0.0 - 0.4 x10E3/uL   Basophils Absolute 0.0 0.0 - 0.2 x10E3/uL   Immature Granulocytes 0 Not Estab. %   Immature Grans (Abs) 0.0 0.0 - 0.1 x10E3/uL  Comprehensive metabolic panel   Collection Time: 10/04/23  3:37 PM  Result Value Ref Range   Glucose 99 70 - 99 mg/dL   BUN 4 (L) 6 - 20 mg/dL   Creatinine, Ser 9.14 0.76 - 1.27 mg/dL   eGFR 885 >40 fO/fpw/8.26   BUN/Creatinine Ratio 5 (L) 9 - 20   Sodium 142 134 - 144 mmol/L   Potassium 3.7 3.5 - 5.2 mmol/L   Chloride 102 96 - 106 mmol/L   CO2 25 20 - 29 mmol/L   Calcium 8.5 (L) 8.7 - 10.2 mg/dL   Total Protein 7.0 6.0 - 8.5 g/dL   Albumin 3.5 (L) 4.1 - 5.1 g/dL   Globulin, Total 3.5 1.5 - 4.5 g/dL   Bilirubin Total 1.0 0.0 - 1.2 mg/dL   Alkaline Phosphatase 162 (H) 44 - 121 IU/L   AST 37 0 - 40 IU/L  ALT 13 0 - 44 IU/L  Hemoglobin A1c   Collection Time: 10/04/23  3:37 PM  Result Value Ref Range   Hgb A1c MFr Bld 5.0 4.8 - 5.6 %   Est. average glucose Bld gHb Est-mCnc 97 mg/dL  Lipid panel   Collection Time: 10/04/23  3:37 PM  Result Value Ref Range   Cholesterol, Total 149 100 - 199 mg/dL   Triglycerides 74 0 - 149 mg/dL   HDL 65 >60 mg/dL   VLDL Cholesterol Cal 14 5 - 40 mg/dL   LDL Chol Calc (NIH) 70 0 - 99 mg/dL   Chol/HDL Ratio 2.3 0.0 - 5.0 ratio  TSH   Collection Time: 10/04/23  3:37 PM  Result Value Ref Range   TSH 2.370 0.450 - 4.500 uIU/mL  INR/PT   Collection Time: 10/04/23  3:37 PM  Result Value Ref Range   INR 1.2 0.9 - 1.2   Prothrombin Time 13.0 (H) 9.1 - 12.0 sec  Lactic acid, plasma   Collection Time: 10/04/23  3:37 PM  Result Value Ref Range   Lactate, Ven 15.2 4.8 - 25.7 mg/dL   {Labs (Neupnwjo):76220}       Assessment & Plan:   Problem List Items Addressed This Visit   None    Assessment and Plan Assessment & Plan         Follow up plan: No follow-ups on file.

## 2024-06-06 NOTE — Progress Notes (Deleted)
 Established patient visit  Patient: Craig Boyer   DOB: [birth year]   38 y.o. Male  MRN: [id number] Visit Date: 06/07/2024  Today's healthcare provider: Jolynn Spencer, PA-C   No chief complaint on file.  Subjective       Discussed the use of AI scribe software for clinical note transcription with the patient, who gave verbal consent to proceed.  History of Present Illness    Copied from CRM [id number]. Topic: General - Other >> Apr 18, 2024  3:52 PM Wess RAMAN wrote: Reason for CRM: Patient's mother, Charles Andringa, would like the appointment confirmation for 8/8 faxed to [facility].   Fax #: [phone] Phone #: [phone] >> Apr 18, 2024  4:10 PM Antwanette L wrote: The patient mother is calling because she needs us  to send over an appt confirmation (05/25/24 with Michelene Cower) to [facility] ) on [address] [place]         10/04/2023    2:37 PM  Depression screen PHQ 2/9  Decreased Interest 0  Down, Depressed, Hopeless 1  PHQ - 2 Score 1  Altered sleeping 1  Tired, decreased energy 1  Change in appetite 1  Feeling bad or failure about yourself  0  Trouble concentrating 0  Moving slowly or fidgety/restless 0  Suicidal thoughts 0  PHQ-9 Score 4  Difficult doing work/chores Not difficult at all      10/04/2023    2:40 PM  GAD 7 : Generalized Anxiety Score  Nervous, Anxious, on Edge 1  Control/stop worrying 0  Worry too much - different things 0  Trouble relaxing 0  Restless 0  Easily annoyed or irritable 1  Afraid - awful might happen 0  Total GAD 7 Score 2  Anxiety Difficulty Not difficult at all    Medications: Outpatient Medications Prior to Visit  Medication Sig   alprazolam  (XANAX ) 2 MG tablet Take 1 tablet (2 mg total) by mouth 2 (two) times daily as needed.   cephALEXin  (KEFLEX ) 500 MG capsule Take 1 capsule (500 mg total) by mouth 2 (two) times daily.   doxycycline  (VIBRA -TABS) 100 MG tablet Take 1 tablet (100 mg  total) by mouth 2 (two) times daily.   methadone (DOLOPHINE) 10 MG tablet Take 60 mg by mouth every 8 (eight) hours.   valsartan  (DIOVAN ) 40 MG tablet Take 1 tablet (40 mg total) by mouth daily.   No facility-administered medications prior to visit.    Review of Systems  All other systems reviewed and are negative.  All negative Except see HPI   {Insert previous labs (optional):23779} {See past labs  Heme  Chem  Endocrine  Serology  Results Review (optional):1}   Objective    There were no vitals taken for this visit. {Insert last BP/Wt (optional):23777}{See vitals history (optional):1}   Physical Exam Vitals reviewed.  Constitutional:      General: He is not in acute distress.    Appearance: Normal appearance. He is not diaphoretic.  HENT:     Head: Normocephalic and atraumatic.  Eyes:     General: No scleral icterus.    Conjunctiva/sclera: Conjunctivae normal.  Cardiovascular:     Rate and Rhythm: Normal rate and regular rhythm.     Pulses: Normal pulses.     Heart sounds: Normal heart sounds. No murmur heard. Pulmonary:     Effort: Pulmonary effort is normal. No respiratory distress.     Breath sounds: Normal breath sounds. No wheezing or rhonchi.  Musculoskeletal:     Cervical back: Neck supple.     Right lower leg: No edema.     Left lower leg: No edema.  Lymphadenopathy:     Cervical: No cervical adenopathy.  Skin:    General: Skin is warm and dry.     Findings: No rash.  Neurological:     Mental Status: He is alert and oriented to person, place, and time. Mental status is at baseline.  Psychiatric:        Mood and Affect: Mood normal.        Behavior: Behavior normal.      No results found for any visits on 06/07/24.      Assessment and Plan Assessment & Plan     No orders of the defined types were placed in this encounter.   No follow-ups on file.   The patient was advised to call back or seek an in-person evaluation if the symptoms  worsen or if the condition fails to improve as anticipated.  I discussed the assessment and treatment plan with the patient. The patient was provided an opportunity to ask questions and all were answered. The patient agreed with the plan and demonstrated an understanding of the instructions.  I, Jadzia Ibsen, PA-C have reviewed all documentation for this visit. The documentation on 06/07/2024  for the exam, diagnosis, procedures, and orders are all accurate and complete.  Jolynn Spencer, [facility], MMS [facility] [phone] (phone) [phone] (fax)  [facility]

## 2024-06-07 ENCOUNTER — Ambulatory Visit: Payer: MEDICAID | Admitting: Physician Assistant

## 2024-06-07 DIAGNOSIS — F410 Panic disorder [episodic paroxysmal anxiety] without agoraphobia: Secondary | ICD-10-CM

## 2024-06-07 DIAGNOSIS — I10 Essential (primary) hypertension: Secondary | ICD-10-CM

## 2024-06-07 DIAGNOSIS — F4310 Post-traumatic stress disorder, unspecified: Secondary | ICD-10-CM

## 2024-06-07 DIAGNOSIS — F99 Mental disorder, not otherwise specified: Secondary | ICD-10-CM

## 2024-06-12 ENCOUNTER — Ambulatory Visit: Payer: MEDICAID

## 2024-06-12 VITALS — BP 156/94 | HR 117 | Resp 20 | Ht 77.0 in | Wt 366.0 lb

## 2024-06-12 DIAGNOSIS — I890 Lymphedema, not elsewhere classified: Secondary | ICD-10-CM | POA: Diagnosis not present

## 2024-06-12 DIAGNOSIS — Z113 Encounter for screening for infections with a predominantly sexual mode of transmission: Secondary | ICD-10-CM

## 2024-06-12 DIAGNOSIS — I10 Essential (primary) hypertension: Secondary | ICD-10-CM

## 2024-06-12 DIAGNOSIS — Z114 Encounter for screening for human immunodeficiency virus [HIV]: Secondary | ICD-10-CM

## 2024-06-12 DIAGNOSIS — L03115 Cellulitis of right lower limb: Secondary | ICD-10-CM

## 2024-06-12 DIAGNOSIS — A630 Anogenital (venereal) warts: Secondary | ICD-10-CM | POA: Insufficient documentation

## 2024-06-12 DIAGNOSIS — R0989 Other specified symptoms and signs involving the circulatory and respiratory systems: Secondary | ICD-10-CM | POA: Insufficient documentation

## 2024-06-12 DIAGNOSIS — Z1159 Encounter for screening for other viral diseases: Secondary | ICD-10-CM

## 2024-06-12 MED ORDER — IMIQUIMOD 5 % EX CREA: TOPICAL_CREAM | CUTANEOUS | 0 refills | Status: DC

## 2024-06-12 MED ORDER — DOXYCYCLINE HYCLATE 100 MG PO TABS: 100.0000 mg | ORAL_TABLET | Freq: Two times a day (BID) | ORAL | 0 refills | Status: AC

## 2024-06-12 MED ORDER — TRIAMCINOLONE ACETONIDE 0.1 % EX OINT: 1.0000 | TOPICAL_OINTMENT | Freq: Two times a day (BID) | CUTANEOUS | 1 refills | Status: AC

## 2024-06-12 NOTE — Assessment & Plan Note (Signed)
 Uncontrolled, chronic. Previously prescribed valsartan  but never started it because thought it may interact with methadone. Discussed with patient that it is safe to take valsartan  and methadone. Patient will restart valsartan .  BP Readings from Last 3 Encounters:  06/12/24 (!) 156/94  10/04/23 (!) 162/101

## 2024-06-12 NOTE — Assessment & Plan Note (Signed)
 Previously seen by wound clinic for R leg lymphedema. Has been wrapping leg with gauze and compression sock daily. I am concerned about the erythema and malodorous purulent drainage from the leg, will treat with antibiotic as above. I am also concerned about diminished pulses in R leg. Previously was referred for ABI but never completed. Placed a new order and encouraged patient to get ABI done ASAP. Refilled triamcinolone  ointment for patient to use after completion of antibiotic. Will refer back to wound care clinic for further management.

## 2024-06-12 NOTE — Assessment & Plan Note (Addendum)
 Patient with 3 month hx of genital warts located in skin fold of R groin. Exam notable for multiple erythematous exophytic verrucous papules in R groin. 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  - Discussed treatment, will trial Imiquimod  5% cream. Consider cryotherapy at follow up. - Will screen for HIV, syphilis - Consider biopsy if unresponsive to treatment - Follow up in 3 months

## 2024-06-12 NOTE — Assessment & Plan Note (Signed)
 Patient with hx of lymphedema, now with several month hx of worsening erythema and edema of R leg. Weeping with purulent malodorous discharge. Ddx includes cellulitis, venous insufficiency, worsening lymphedema.  - Will treat with doxycyline x 7 days - Will obtain lab work as below - Return precautions given

## 2024-06-12 NOTE — Progress Notes (Signed)
 Established patient visit   Patient: Craig Boyer   DOB: 05/24/1986   38 y.o. Male  MRN: [id number] Visit Date: 06/12/2024  Today's healthcare provider: Isaiah DELENA Pepper, MD   Chief Complaint  Patient presents with   Genital Warts    Bumps in genital area mainly R groin, bleeds when wiping x 3 months per pt look like skin tags.  Decline vaccines due.   Leg Swelling    Was seeing a wound specialist for swelling possible cellulitis. Discuss if he needs a new wound specialist (does good when he has triamcinolone  ointment). R leg swelling   Subjective     HPI  Skin tags Located in genital area No new sexual contacts Showed up about 3 months ago Located on R thigh Bleeds every time he wipes Denies pain, fevers, chills, night sweats   Lymphedema, R leg Previously saw wound care but hasn't seen since March Weeping for the past few months Red and tender today Patient's partner helps with wound care Wears compression sleeve daily  HTN Stopped taking valsartan  because told that it interacts with methadone   Medications: Outpatient Medications Prior to Visit  Medication Sig   alprazolam  (XANAX ) 2 MG tablet Take 1 tablet (2 mg total) by mouth 2 (two) times daily as needed.   methadone (DOLOPHINE) 10 MG tablet Take 60 mg by mouth every 8 (eight) hours.   valsartan  (DIOVAN ) 40 MG tablet Take 1 tablet (40 mg total) by mouth daily.   [DISCONTINUED] cephALEXin  (KEFLEX ) 500 MG capsule Take 1 capsule (500 mg total) by mouth 2 (two) times daily.   [DISCONTINUED] doxycycline  (VIBRA -TABS) 100 MG tablet Take 1 tablet (100 mg total) by mouth 2 (two) times daily.   [DISCONTINUED] triamcinolone  ointment (KENALOG ) 0.1 % Apply 1 Application topically daily. (Patient not taking: Reported on 06/12/2024)   No facility-administered medications prior to visit.    Review of Systems as noted in HPI.      Objective    BP (!) 156/94 (BP Location: Right Arm, Patient Position: Sitting,  Cuff Size: Large)   Pulse (!) 117   Resp 20   Ht 6' 5 (1.956 m)   Wt (!) 366 lb (166 kg)   SpO2 97%   BMI 43.40 kg/m    Physical Exam Constitutional:      Appearance: Normal appearance.  HENT:     Head: Normocephalic and atraumatic.     Mouth/Throat:     Mouth: Mucous membranes are moist.  Eyes:     Pupils: Pupils are equal, round, and reactive to light.  Pulmonary:     Effort: Pulmonary effort is normal.  Genitourinary:    Comments: Multiple erythematous exophytic verrucous papules located in R groin Skin:    General: Skin is warm.     Comments: R leg: 2+ edema, erythematous, and mildly TTP. Weeping purulent malodorous fluid.  Neurological:     General: No focal deficit present.     Mental Status: He is alert.      No results found for any visits on 06/12/24.  Assessment & Plan     Problem List Items Addressed This Visit       Cardiovascular and Mediastinum   Primary hypertension   Uncontrolled, chronic. Previously prescribed valsartan  but never started it because thought it may interact with methadone. Discussed with patient that it is safe to take valsartan  and methadone. Patient will restart valsartan .  BP Readings from Last 3 Encounters:  06/12/24 (!) 156/94  10/04/23 (!) 162/101           Musculoskeletal and Integument   Genital warts   Patient with 3 month hx of genital warts located in skin fold of R groin. Exam notable for multiple erythematous exophytic verrucous papules in R groin. 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  - Discussed treatment, will trial Imiquimod  5% cream. Consider cryotherapy at follow up. - Will screen for HIV, syphilis - Consider biopsy if unresponsive to treatment - Follow up in 3 months      Relevant Medications   imiquimod  (ALDARA ) 5 % cream (Start on 06/13/2024)     Other   Cellulitis of right lower extremity   Patient with hx of lymphedema, now with several month hx of worsening erythema and edema of R leg. Weeping with  purulent malodorous discharge. Ddx includes cellulitis, venous insufficiency, worsening lymphedema.  - Will treat with doxycyline x 7 days - Will obtain lab work as below - Return precautions given      Relevant Medications   doxycycline  (VIBRA -TABS) 100 MG tablet   Other Relevant Orders   CBC   Basic metabolic panel with GFR   Ambulatory referral to Wound Clinic   Lymphedema - Primary   Previously seen by wound clinic for R leg lymphedema. Has been wrapping leg with gauze and compression sock daily. I am concerned about the erythema and malodorous purulent drainage from the leg, will treat with antibiotic as above. I am also concerned about diminished pulses in R leg. Previously was referred for ABI but never completed. Placed a new order and encouraged patient to get ABI done ASAP. Refilled triamcinolone  ointment for patient to use after completion of antibiotic. Will refer back to wound care clinic for further management.      Relevant Medications   triamcinolone  ointment (KENALOG ) 0.1 %   Other Relevant Orders   Ambulatory referral to Wound Clinic   US  ARTERIAL ABI (SCREENING LOWER EXTREMITY)   Other Visit Diagnoses       Screening for HIV (human immunodeficiency virus)       Relevant Orders   HIV Antibody (routine testing w rflx)     Need for hepatitis C screening test       Relevant Orders   Hepatitis C antibody     Screening examination for STI       Relevant Orders   RPR          Return in about 3 months (around 09/12/2024) for Follow up.       Isaiah DELENA Pepper, MD  [facility] [phone] (phone) [phone] (fax)

## 2024-06-13 ENCOUNTER — Ambulatory Visit: Payer: Self-pay

## 2024-06-13 LAB — CBC
Hematocrit: 40.2 % (ref 37.5–51.0)
Hemoglobin: 13.8 g/dL (ref 13.0–17.7)
MCH: 30.3 pg (ref 26.6–33.0)
MCHC: 34.3 g/dL (ref 31.5–35.7)
MCV: 88 fL (ref 79–97)
Platelets: 108 x10E3/uL — ABNORMAL LOW (ref 150–450)
RBC: 4.56 x10E6/uL (ref 4.14–5.80)
RDW: 13.3 % (ref 11.6–15.4)
WBC: 4 x10E3/uL (ref 3.4–10.8)

## 2024-06-13 LAB — BASIC METABOLIC PANEL WITH GFR
BUN/Creatinine Ratio: 8 — ABNORMAL LOW (ref 9–20)
BUN: 6 mg/dL (ref 6–20)
CO2: 22 mmol/L (ref 20–29)
Calcium: 9.1 mg/dL (ref 8.7–10.2)
Chloride: 103 mmol/L (ref 96–106)
Creatinine, Ser: 0.77 mg/dL (ref 0.76–1.27)
Glucose: 106 mg/dL — ABNORMAL HIGH (ref 70–99)
Potassium: 4.2 mmol/L (ref 3.5–5.2)
Sodium: 137 mmol/L (ref 134–144)
eGFR: 117 mL/min/1.73 (ref >59)

## 2024-06-13 LAB — HIV ANTIBODY (ROUTINE TESTING W REFLEX): HIV Screen 4th Generation wRfx: NONREACTIVE

## 2024-06-13 LAB — HEPATITIS C ANTIBODY: Hep C Virus Ab: NONREACTIVE

## 2024-06-13 LAB — RPR: RPR Ser Ql: NONREACTIVE

## 2024-06-20 ENCOUNTER — Ambulatory Visit: Payer: MEDICAID

## 2024-06-25 ENCOUNTER — Ambulatory Visit
Admission: RE | Admit: 2024-06-25 | Discharge: 2024-06-25 | Disposition: A | Discharge status: Home / Self Care | Payer: MEDICAID | Source: Ambulatory Visit

## 2024-06-25 DIAGNOSIS — I890 Lymphedema, not elsewhere classified: Secondary | ICD-10-CM | POA: Diagnosis present

## 2024-07-03 ENCOUNTER — Other Ambulatory Visit: Payer: Self-pay

## 2024-07-03 DIAGNOSIS — I878 Other specified disorders of veins: Secondary | ICD-10-CM

## 2024-07-11 ENCOUNTER — Other Ambulatory Visit: Payer: Self-pay

## 2024-07-11 DIAGNOSIS — A630 Anogenital (venereal) warts: Secondary | ICD-10-CM

## 2024-07-11 NOTE — Telephone Encounter (Unsigned)
 Copied from CRM #[id number]. Topic: Clinical - Medication Refill >> Jul 11, 2024  1:14 PM Shardie S wrote: Medication: imiquimod  (ALDARA ) 5 % cream  Has the patient contacted their pharmacy? Yes (Agent: If no, request that the patient contact the pharmacy for the refill. If patient does not wish to contact the pharmacy document the reason why and proceed with request.) (Agent: If yes, when and what did the pharmacy advise?)  This is the patient's preferred pharmacy:  [facility] - [place], [place] - [address] [place] [address] Phone: [phone] Fax: [phone]  Is this the correct pharmacy for this prescription? Yes If no, delete pharmacy and type the correct one.   Has the prescription been filled recently? No  Is the patient out of the medication? Yes  Has the patient been seen for an appointment in the last year OR does the patient have an upcoming appointment? Yes  Can we respond through MyChart? No  Agent: Please be advised that Rx refills may take up to 3 business days. We ask that you follow-up with your pharmacy.

## 2024-07-12 NOTE — Telephone Encounter (Signed)
 Requested medications are due for refill today.  unsure  Requested medications are on the active medications list.  yes  Last refill. 06/13/2024 12 0 rf  Future visit scheduled.   yes  Notes to clinic.  Medication not assigned to a protocol. Please review for refill.    Requested Prescriptions  Pending Prescriptions Disp Refills   imiquimod  (ALDARA ) 5 % cream 12 each 0    Sig: Apply topically 3 (three) times a week. Apply prior to bedtime; leave on skin for 6 to 10 hours, then remove with mild soap and water.     Off-Protocol Failed - 07/12/2024  5:02 PM      Failed - Medication not assigned to a protocol, review manually.      Failed - Valid encounter within last 12 months    Recent Outpatient Visits           1 month ago Lymphedema   [facility] Isaiah LABOR, MD

## 2024-07-13 ENCOUNTER — Other Ambulatory Visit: Payer: Self-pay

## 2024-07-13 DIAGNOSIS — A630 Anogenital (venereal) warts: Secondary | ICD-10-CM

## 2024-07-17 ENCOUNTER — Encounter (INDEPENDENT_AMBULATORY_CARE_PROVIDER_SITE_OTHER): Payer: MEDICAID | Admitting: Nurse Practitioner

## 2024-07-23 ENCOUNTER — Encounter (INDEPENDENT_AMBULATORY_CARE_PROVIDER_SITE_OTHER): Payer: MEDICAID | Admitting: Nurse Practitioner

## 2024-07-26 ENCOUNTER — Encounter (INDEPENDENT_AMBULATORY_CARE_PROVIDER_SITE_OTHER): Payer: MEDICAID | Admitting: Vascular Surgery

## 2024-08-14 ENCOUNTER — Encounter (INDEPENDENT_AMBULATORY_CARE_PROVIDER_SITE_OTHER): Payer: MEDICAID | Admitting: Vascular Surgery

## 2024-08-22 ENCOUNTER — Ambulatory Visit (INDEPENDENT_AMBULATORY_CARE_PROVIDER_SITE_OTHER): Payer: MEDICAID | Admitting: Vascular Surgery

## 2024-08-22 ENCOUNTER — Encounter (INDEPENDENT_AMBULATORY_CARE_PROVIDER_SITE_OTHER): Payer: Self-pay | Admitting: Vascular Surgery

## 2024-08-22 VITALS — BP 160/102 | HR 112 | Resp 18 | Ht 78.0 in | Wt 383.0 lb

## 2024-08-22 DIAGNOSIS — I890 Lymphedema, not elsewhere classified: Secondary | ICD-10-CM | POA: Diagnosis not present

## 2024-08-22 DIAGNOSIS — I10 Essential (primary) hypertension: Secondary | ICD-10-CM

## 2024-08-27 NOTE — Progress Notes (Signed)
 Subjective:    Patient ID: Craig Boyer, male    DOB: [birth year], 38 y.o.   MRN: [id number] Chief Complaint  Patient presents with   New Patient (Initial Visit)    Ref Franchot consult venous stasis of lower extremity. Patient with hx of non healing wounds in right leg with recent abi    Craig Boyer is a 38 yo male who presents to clinic today with chief complaint of right lower extremity lymphedema.  Upon assessment patient is noted to have right lower extremity +4 lymphedema with nonhealing ulcers.  He is also has a keratotic skin that is peeling and appears to have some blistering with that as well.  Patient is morbidly obese and has had difficulties with his lower extremities for years.  Over the last 3 to 4 months his right lower extremity has gotten worse.  It also has severe discoloration where its become purple on the lateral and posterior side of his calf.  Denies any pain to his lower extremity just states that it feels uncomfortable.  He endorses primary care sent in for evaluation.    Review of Systems  Constitutional: Negative.   Cardiovascular:  Positive for leg swelling.       Patient with morbid obesity and right lower extremity +4 edema  Skin:  Positive for color change and wound.       Multiple wounds to his right lower extremity with color change for the last 3 to 4 months.  All other systems reviewed and are negative.      Objective:   Physical Exam Vitals reviewed.  Constitutional:      Appearance: He is obese.     Comments: Patient presents to clinic today disheveled and anxious.  Patient has not been bathing properly.  HENT:     Head: Normocephalic.  Eyes:     Pupils: Pupils are equal, round, and reactive to light.  Cardiovascular:     Rate and Rhythm: Normal rate and regular rhythm.     Pulses: Normal pulses.     Heart sounds: Normal heart sounds.  Pulmonary:     Effort: Pulmonary effort is normal.     Breath sounds: Normal breath sounds.  Abdominal:      General: Bowel sounds are normal.     Palpations: Abdomen is soft.  Musculoskeletal:        General: Swelling present.     Right lower leg: Edema present.     Left lower leg: Edema present.  Skin:    General: Skin is warm and dry.     Capillary Refill: Capillary refill takes 2 to 3 seconds.     Findings: Erythema and wound present.     Comments: Patient noted to have a dry keratotic flaking skin  Neurological:     General: No focal deficit present.     Mental Status: He is alert and oriented to person, place, and time. Mental status is at baseline.  Psychiatric:        Mood and Affect: Mood normal.        Thought Content: Thought content normal.     Comments: Patient seemed extremely anxious     BP (!) 160/102   Pulse (!) 112   Resp 18   Ht 6' 6 (1.981 m)   Wt (!) 383 lb (173.7 kg)   BMI 44.26 kg/m   Past Medical History:  Diagnosis Date   Panic disorder    PTSD (post-traumatic stress disorder)  Social anxiety disorder    White coat syndrome without hypertension     Social History   Socioeconomic History   Marital status: Single    Spouse name: Not on file   Number of children: Not on file   Years of education: Not on file   Highest education level: Not on file  Occupational History   Not on file  Tobacco Use   Smoking status: Never   Smokeless tobacco: Never  Vaping Use   Vaping status: Never Used  Substance and Sexual Activity   Alcohol use: Yes    Comment: rare   Drug use: Never   Sexual activity: Yes  Other Topics Concern   Not on file  Social History Narrative   Not on file   Social Drivers of Health   Financial Resource Strain: High Risk (02/24/2018)   Received from [facility]   Overall Financial Resource Strain (CARDIA)    Difficulty of Paying Living Expenses: Very hard  Food Insecurity: Food Insecurity Present (02/24/2018)   Received from [facility]   Hunger Vital Sign    Worried About Running Out of Food in the Last Year:  Often true    Ran Out of Food in the Last Year: Often true  Transportation Needs: No Transportation Needs (02/24/2018)   Received from [facility]   PRAPARE - Transportation    Lack of Transportation (Medical): No    Lack of Transportation (Non-Medical): No  Physical Activity: Insufficiently Active (02/24/2018)   Received from [facility]   Exercise Vital Sign    Days of Exercise per Week: 1 day    Minutes of Exercise per Session: 60 min  Stress: No Stress Concern Present (02/24/2018)   Received from [facility] of [facility] - Occupational Stress Questionnaire    Feeling of Stress : Not at all  Social Connections: Somewhat Isolated (02/24/2018)   Received from [facility]   Social Connection and Isolation Panel    Frequency of Communication with Friends and Family: More than three times a week    Frequency of Social Gatherings with Friends and Family: More than three times a week    Attends Religious Services: 1 to 4 times per year    Active Member of Clubs or Organizations: No    Attends Banker Meetings: Never    Marital Status: Divorced  Catering Manager Violence: Not At Risk (02/24/2018)   Received from [facility]   Humiliation, Afraid, Rape, and Kick questionnaire    Fear of Current or Ex-Partner: No    Emotionally Abused: No    Physically Abused: No    Sexually Abused: No    Past Surgical History:  Procedure Laterality Date   pinky surgery     sinus fracture      Family History  Problem Relation Age of Onset   Throat cancer Father    Cancer Sister     No Known Allergies     Latest Ref Rng & Units 06/12/2024    3:26 PM 10/04/2023    3:37 PM  CBC  WBC 3.4 - 10.8 x10E3/uL 4.0  3.6   Hemoglobin 13.0 - 17.7 g/dL 86.1  85.5   Hematocrit 37.5 - 51.0 % 40.2  42.9   Platelets 150 - 450 x10E3/uL 108  114       CMP     Component Value Date/Time   NA 137 06/12/2024 1526   K  4.2 06/12/2024 1526   CL  103 06/12/2024 1526   CO2 22 06/12/2024 1526   GLUCOSE 106 (H) 06/12/2024 1526   BUN 6 06/12/2024 1526   CREATININE 0.77 06/12/2024 1526   CALCIUM 9.1 06/12/2024 1526   PROT 7.0 10/04/2023 1537   ALBUMIN 3.5 (L) 10/04/2023 1537   AST 37 10/04/2023 1537   ALT 13 10/04/2023 1537   ALKPHOS 162 (H) 10/04/2023 1537   BILITOT 1.0 10/04/2023 1537   EGFR 117 06/12/2024 1526     No results found.     Assessment & Plan:   1. Lymphedema (Primary) Patient presents to clinic today with right lower extremity +4 lymphedema with nonhealing wounds and ulcers.  Patient recently underwent arterial duplex ultrasound of his right lower extremity with an ABI of 1.48.  He has plenty of good arterial blood flow.  Therefore this is all related to vascular insufficiency and lymphedema complicated by his morbid obesity and his primary hypertension.  Patient will be placed in Unna boot wraps for 4 weeks to be changed weekly.  Patient will be set up with home health to do this.  Patient will follow-up in clinic on week 5 with provider for reevaluation.  If patient's swelling has decreased considerably but he continues to have nonhealing ulcerations and wounds he will be redirected to the wound clinic.  2. Primary hypertension Continue antihypertensive medications as already ordered, these medications have been reviewed and there are no changes at this time.  3. Morbid obesity (HCC) I had greater than 10-minute conversation with the patient regarding his weight.  Patient is morbidly obese at 383 pounds today.  I discussed in detail with him how his weight is affecting the pressure on his legs and thus increasing the swelling to his bilateral lower extremities but right greater than left.  His morbid obesity with this hypertension is increasing pressure to his lower legs stretching his skin thus causing ulcers that will not heal.  I encouraged him to lose weight.  I encouraged him to see his primary care related to  new weight loss medications he may be able to take to lose some weight.   Current Outpatient Medications on File Prior to Visit  Medication Sig Dispense Refill   alprazolam  (XANAX ) 2 MG tablet Take 1 tablet (2 mg total) by mouth 2 (two) times daily as needed. 14 tablet 0   imiquimod  (ALDARA ) 5 % cream APPLY TOPICALLY 3 TIMES A WEEK PRIOR TO BEDTIME. LEAVE ON SKIN FOR 6-10 HOURS. THEN REMOVE WITH MILD SOAP AND WATER 12 each 0   methadone (DOLOPHINE) 10 MG tablet Take 60 mg by mouth every 8 (eight) hours.     triamcinolone  ointment (KENALOG ) 0.1 % Apply 1 Application topically 2 (two) times daily. 453 g 1   valsartan  (DIOVAN ) 40 MG tablet Take 1 tablet (40 mg total) by mouth daily. 90 tablet 3   No current facility-administered medications on file prior to visit.    There are no Patient Instructions on file for this visit. No follow-ups on file.   Gwendlyn JONELLE Shank, NP

## 2024-08-29 ENCOUNTER — Ambulatory Visit (INDEPENDENT_AMBULATORY_CARE_PROVIDER_SITE_OTHER): Payer: MEDICAID | Admitting: Nurse Practitioner

## 2024-08-29 VITALS — BP 156/97 | HR 94 | Resp 18 | Ht 78.0 in | Wt 380.0 lb

## 2024-08-29 DIAGNOSIS — I890 Lymphedema, not elsewhere classified: Secondary | ICD-10-CM | POA: Diagnosis not present

## 2024-08-29 NOTE — Progress Notes (Signed)
 History of Present Illness  There is no documented history at this time  Assessments & Plan   There are no diagnoses linked to this encounter.    Additional instructions  Subjective:  Patient presents with swelling of the Right lower extremity.    Procedure:  3 layer unna wrap was placed Right lower extremity.   Legs cleansed with antimicrobial soap and water, patient noted severe itching in leg to the point where he began to scratch his upper leg through the wrap. Wrapped with Pink unna this time around to see if patient sees improvement with itching.  Plan:   Follow up in one week.

## 2024-09-02 ENCOUNTER — Encounter (INDEPENDENT_AMBULATORY_CARE_PROVIDER_SITE_OTHER): Payer: Self-pay | Admitting: Nurse Practitioner

## 2024-09-05 ENCOUNTER — Ambulatory Visit (INDEPENDENT_AMBULATORY_CARE_PROVIDER_SITE_OTHER): Payer: MEDICAID | Admitting: Nurse Practitioner

## 2024-09-05 ENCOUNTER — Encounter (INDEPENDENT_AMBULATORY_CARE_PROVIDER_SITE_OTHER): Payer: Self-pay

## 2024-09-05 VITALS — BP 134/86 | HR 94 | Resp 18

## 2024-09-05 DIAGNOSIS — I890 Lymphedema, not elsewhere classified: Secondary | ICD-10-CM

## 2024-09-05 NOTE — Progress Notes (Signed)
 History of Present Illness  There is no documented history at this time  Assessments & Plan   There are no diagnoses linked to this encounter.    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    Procedure:  3 layer unna wrap was placed Right lower extremity.   Plan:   Follow up in one week.

## 2024-09-11 ENCOUNTER — Ambulatory Visit: Payer: MEDICAID

## 2024-09-11 NOTE — Progress Notes (Deleted)
      Established patient visit   Patient: Craig Boyer   DOB: 11-Mar-1986   38 y.o. Male  MRN: [id number] Visit Date: 09/11/2024  Today's healthcare provider: Isaiah DELENA Pepper, MD   No chief complaint on file.  Subjective    HPI  Discussed the use of AI scribe software for clinical note transcription with the patient, who gave verbal consent to proceed.  History of Present Illness      Medications: Outpatient Medications Prior to Visit  Medication Sig   alprazolam  (XANAX ) 2 MG tablet Take 1 tablet (2 mg total) by mouth 2 (two) times daily as needed.   imiquimod  (ALDARA ) 5 % cream APPLY TOPICALLY 3 TIMES A WEEK PRIOR TO BEDTIME. LEAVE ON SKIN FOR 6-10 HOURS. THEN REMOVE WITH MILD SOAP AND WATER   methadone (DOLOPHINE) 10 MG tablet Take 60 mg by mouth every 8 (eight) hours.   triamcinolone  ointment (KENALOG ) 0.1 % Apply 1 Application topically 2 (two) times daily.   valsartan  (DIOVAN ) 40 MG tablet Take 1 tablet (40 mg total) by mouth daily.   No facility-administered medications prior to visit.    Review of Systems as noted in HPI.  {Insert previous labs (optional):23779} {See past labs  Heme  Chem  Endocrine  Serology  Results Review (optional):1}   Objective    There were no vitals taken for this visit. {Insert last BP/Wt (optional):23777}{See vitals history (optional):1}  Physical Exam   No results found for any visits on 09/11/24.  Assessment & Plan     Problem List Items Addressed This Visit   None   Assessment and Plan Assessment & Plan       No follow-ups on file.       Isaiah DELENA Pepper, MD  [facility] [phone] (phone) [phone] (fax)

## 2024-09-12 ENCOUNTER — Ambulatory Visit: Payer: MEDICAID

## 2024-09-12 ENCOUNTER — Encounter (INDEPENDENT_AMBULATORY_CARE_PROVIDER_SITE_OTHER): Payer: Self-pay

## 2024-09-12 ENCOUNTER — Ambulatory Visit (INDEPENDENT_AMBULATORY_CARE_PROVIDER_SITE_OTHER): Payer: MEDICAID | Admitting: Nurse Practitioner

## 2024-09-12 VITALS — BP 151/94 | HR 88 | Resp 18 | Ht 78.0 in | Wt 369.6 lb

## 2024-09-12 DIAGNOSIS — I890 Lymphedema, not elsewhere classified: Secondary | ICD-10-CM

## 2024-09-12 NOTE — Progress Notes (Signed)
 History of Present Illness  There is no documented history at this time  Assessments & Plan   There are no diagnoses linked to this encounter.    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    Procedure:  3 layer unna wrap was placed Right lower extremity.   Plan:   Follow up in one week.

## 2024-09-17 ENCOUNTER — Encounter (INDEPENDENT_AMBULATORY_CARE_PROVIDER_SITE_OTHER): Payer: Self-pay | Admitting: Nurse Practitioner

## 2024-09-19 ENCOUNTER — Ambulatory Visit (INDEPENDENT_AMBULATORY_CARE_PROVIDER_SITE_OTHER): Payer: MEDICAID | Admitting: Nurse Practitioner

## 2024-09-19 ENCOUNTER — Encounter (INDEPENDENT_AMBULATORY_CARE_PROVIDER_SITE_OTHER): Payer: Self-pay

## 2024-09-19 VITALS — BP 142/92 | HR 91 | Resp 18 | Ht 78.0 in | Wt 375.4 lb

## 2024-09-19 DIAGNOSIS — I890 Lymphedema, not elsewhere classified: Secondary | ICD-10-CM | POA: Diagnosis not present

## 2024-09-19 NOTE — Progress Notes (Unsigned)
 History of Present Illness  There is no documented history at this time  Assessments & Plan   There are no diagnoses linked to this encounter.    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    Procedure:  3 layer unna wrap was placed Right lower extremity.   Plan:   Follow up in one week.

## 2024-09-23 ENCOUNTER — Encounter (INDEPENDENT_AMBULATORY_CARE_PROVIDER_SITE_OTHER): Payer: Self-pay | Admitting: Nurse Practitioner

## 2024-09-26 ENCOUNTER — Encounter (INDEPENDENT_AMBULATORY_CARE_PROVIDER_SITE_OTHER): Payer: Self-pay | Admitting: Vascular Surgery

## 2024-09-26 ENCOUNTER — Ambulatory Visit (INDEPENDENT_AMBULATORY_CARE_PROVIDER_SITE_OTHER): Payer: MEDICAID | Admitting: Vascular Surgery

## 2024-09-26 VITALS — BP 142/92 | HR 104 | Resp 18 | Ht 78.0 in | Wt 375.4 lb

## 2024-09-26 DIAGNOSIS — I890 Lymphedema, not elsewhere classified: Secondary | ICD-10-CM

## 2024-09-26 DIAGNOSIS — I10 Essential (primary) hypertension: Secondary | ICD-10-CM | POA: Diagnosis not present

## 2024-09-26 NOTE — Progress Notes (Signed)
 Subjective:    Patient ID: Craig Boyer, male    DOB: [birth year], 38 y.o.   MRN: [id number] Chief Complaint  Patient presents with   Follow-up    Follow up 4 week unna     Craig Boyer is a 38 yo male who returns to clinic today after 5 weeks of Unna boot therapy to his right lower extremity swelling.  Today his right lower extremity is much less swollen than it was originally.  Compared to his left leg he is +2 edema at this point in time.  He does still have some keratotic skin to his right lower extremity and he does have discoloration from mid calf to his toes.  This is much improved compared to his initial visit.  He endorses he feels much better and his leg is better which is allowing him to do some more exercise and move easier.  He endorses no new sores or skin breakdown to his lower extremity.      History of Present Illness            Results           Review of Systems  Constitutional: Negative.   Cardiovascular:  Positive for leg swelling.  Musculoskeletal:  Positive for myalgias.  Skin:  Positive for color change.       Purple discoloration from mid calf down to his toes  All other systems reviewed and are negative.      Objective:   Physical Exam Vitals reviewed.  Constitutional:      Appearance: Normal appearance. He is obese.  HENT:     Head: Normocephalic and atraumatic.  Eyes:     Pupils: Pupils are equal, round, and reactive to light.  Cardiovascular:     Rate and Rhythm: Normal rate and regular rhythm.     Pulses: Normal pulses.     Heart sounds: Normal heart sounds.  Pulmonary:     Effort: Pulmonary effort is normal.     Breath sounds: Normal breath sounds.  Abdominal:     General: Bowel sounds are normal.     Palpations: Abdomen is soft.  Musculoskeletal:        General: Swelling present.     Right lower leg: Edema present.  Skin:    General: Skin is warm and dry.     Capillary Refill: Capillary refill takes 2 to 3 seconds.      Comments: Keratotic skin from mid calf down to his ankle but healing and looking better today.  Noted discoloration from mid calf to his toes.  Foot is purple  Neurological:     General: No focal deficit present.     Mental Status: He is alert and oriented to person, place, and time. Mental status is at baseline.  Psychiatric:        Mood and Affect: Mood normal.        Behavior: Behavior normal.        Thought Content: Thought content normal.        Judgment: Judgment normal.     Physical Exam          BP (!) 142/92 (BP Location: Right Arm, Patient Position: Sitting, Cuff Size: Large)   Pulse (!) 104   Resp 18   Ht 6' 6 (1.981 m)   Wt (!) 375 lb 6.4 oz (170.3 kg)   BMI 43.38 kg/m   Past Medical History:  Diagnosis Date   Panic disorder    PTSD (  post-traumatic stress disorder)    Social anxiety disorder    White coat syndrome without hypertension     Social History   Socioeconomic History   Marital status: Single    Spouse name: Not on file   Number of children: Not on file   Years of education: Not on file   Highest education level: Not on file  Occupational History   Not on file  Tobacco Use   Smoking status: Never   Smokeless tobacco: Never  Vaping Use   Vaping status: Never Used  Substance and Sexual Activity   Alcohol use: Yes    Comment: rare   Drug use: Never   Sexual activity: Yes  Other Topics Concern   Not on file  Social History Narrative   Not on file   Social Drivers of Health   Financial Resource Strain: High Risk (02/24/2018)   Received from [facility]   Overall Financial Resource Strain (CARDIA)    Difficulty of Paying Living Expenses: Very hard  Food Insecurity: Food Insecurity Present (02/24/2018)   Received from [facility]   Hunger Vital Sign    Worried About Running Out of Food in the Last Year: Often true    Ran Out of Food in the Last Year: Often true  Transportation Needs: No Transportation Needs (02/24/2018)    Received from [facility]   PRAPARE - Transportation    Lack of Transportation (Medical): No    Lack of Transportation (Non-Medical): No  Physical Activity: Insufficiently Active (02/24/2018)   Received from [facility]   Exercise Vital Sign    Days of Exercise per Week: 1 day    Minutes of Exercise per Session: 60 min  Stress: No Stress Concern Present (02/24/2018)   Received from [facility] of [facility]ational Health - Occupational Stress Questionnaire    Feeling of Stress : Not at all  Social Connections: Somewhat Isolated (02/24/2018)   Received from [facility]   Social Connection and Isolation Panel    Frequency of Communication with Friends and Family: More than three times a week    Frequency of Social Gatherings with Friends and Family: More than three times a week    Attends Religious Services: 1 to 4 times per year    Active Member of Clubs or Organizations: No    Attends Banker Meetings: Never    Marital Status: Divorced  Catering Manager Violence: Not At Risk (02/24/2018)   Received from [facility]   Humiliation, Afraid, Rape, and Kick questionnaire    Fear of Current or Ex-Partner: No    Emotionally Abused: No    Physically Abused: No    Sexually Abused: No    Past Surgical History:  Procedure Laterality Date   pinky surgery     sinus fracture      Family History  Problem Relation Age of Onset   Throat cancer Father    Cancer Sister     No Known Allergies     Latest Ref Rng & Units 06/12/2024    3:26 PM 10/04/2023    3:37 PM  CBC  WBC 3.4 - 10.8 x10E3/uL 4.0  3.6   Hemoglobin 13.0 - 17.7 g/dL 86.1  85.5   Hematocrit 37.5 - 51.0 % 40.2  42.9   Platelets 150 - 450 x10E3/uL 108  114       CMP     Component Value Date/Time   NA  137 06/12/2024 1526   K 4.2 06/12/2024 1526   CL 103 06/12/2024 1526   CO2 22 06/12/2024 1526   GLUCOSE 106 (H) 06/12/2024 1526   BUN 6 06/12/2024 1526   CREATININE 0.77  06/12/2024 1526   CALCIUM 9.1 06/12/2024 1526   PROT 7.0 10/04/2023 1537   ALBUMIN 3.5 (L) 10/04/2023 1537   AST 37 10/04/2023 1537   ALT 13 10/04/2023 1537   ALKPHOS 162 (H) 10/04/2023 1537   BILITOT 1.0 10/04/2023 1537   EGFR 117 06/12/2024 1526     No results found.     Assessment & Plan:   1. Lymphedema (Primary) Patient returns today for 5-week follow-up post Unna boot therapy for reevaluation.  He has had Unna boot therapy to his right leg only.  Right leg looks much better today where initially he was +3 to +4 edema today he has +2 edema.  He continues to have some keratotic skin but appears to be healing well.  Plan is to place the patient in another 5 weeks of Unna boot therapy to reduce the rest of the swelling to his lower extremity and possibly help with continued healing of his keratotic skin as well as reducing some of the discoloration to his foot and toes.  Patient agrees with the plan.  Patient will follow-up weekly for Unna boot changes and I will see him on week 5 for another reevaluation.  2. Primary hypertension I had a 10-minute conversation with the patient today regarding blood pressure medications.  Prior he had a bad experience where he had taken too much clonidine and it dropped his blood pressure making him sick to the point where he felt like he was going to die.  He currently is not on that he he is supposed to be taking losartan 40 mg once a day but has not because of the fear of hypotension.  I encouraged him to start taking the blood pressure medication as he needs it and asked him to follow-up with his primary care physician if he feels like the blood pressure medication is making him feel bad.  We discussed how elevated blood pressure can increase swelling to your lower extremities and we want to avoid that with his right lower leg.  Verbalizes understanding and wishes to follow-up with his PCP.  3. Morbid obesity (HCC) Again today I had another long  conversation with him regarding his weight.  I asked him to make this a major priority for his overall health as well as the swelling to his right lower extremity.  We discussed how this may be affecting his healing and his care going forward.  He endorses he is working on it.   Assessment and Plan              Current Outpatient Medications on File Prior to Visit  Medication Sig Dispense Refill   alprazolam  (XANAX ) 2 MG tablet Take 1 tablet (2 mg total) by mouth 2 (two) times daily as needed. 14 tablet 0   imiquimod  (ALDARA ) 5 % cream APPLY TOPICALLY 3 TIMES A WEEK PRIOR TO BEDTIME. LEAVE ON SKIN FOR 6-10 HOURS. THEN REMOVE WITH MILD SOAP AND WATER 12 each 0   methadone (DOLOPHINE) 10 MG tablet Take 60 mg by mouth every 8 (eight) hours.     triamcinolone  ointment (KENALOG ) 0.1 % Apply 1 Application topically 2 (two) times daily. 453 g 1   valsartan  (DIOVAN ) 40 MG tablet Take 1 tablet (40 mg total) by mouth  daily. 90 tablet 3   No current facility-administered medications on file prior to visit.    There are no Patient Instructions on file for this visit. No follow-ups on file.   Gwendlyn JONELLE Shank, NP

## 2024-10-03 ENCOUNTER — Encounter (INDEPENDENT_AMBULATORY_CARE_PROVIDER_SITE_OTHER): Payer: MEDICAID

## 2024-10-04 ENCOUNTER — Encounter (INDEPENDENT_AMBULATORY_CARE_PROVIDER_SITE_OTHER): Payer: MEDICAID

## 2024-10-05 ENCOUNTER — Encounter (INDEPENDENT_AMBULATORY_CARE_PROVIDER_SITE_OTHER): Payer: Self-pay | Admitting: Nurse Practitioner

## 2024-10-05 ENCOUNTER — Ambulatory Visit (INDEPENDENT_AMBULATORY_CARE_PROVIDER_SITE_OTHER): Payer: MEDICAID

## 2024-10-05 VITALS — BP 155/101 | HR 88 | Resp 18 | Wt 375.0 lb

## 2024-10-05 DIAGNOSIS — I890 Lymphedema, not elsewhere classified: Secondary | ICD-10-CM | POA: Diagnosis not present

## 2024-10-05 NOTE — Progress Notes (Unsigned)
 History of Present Illness  There is no documented history at this time  Assessments & Plan   There are no diagnoses linked to this encounter.    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    Procedure:  3 layer unna wrap was placed Right lower extremity.   Plan:   Follow up in one week.

## 2024-10-08 ENCOUNTER — Other Ambulatory Visit: Payer: Self-pay

## 2024-10-08 ENCOUNTER — Encounter (INDEPENDENT_AMBULATORY_CARE_PROVIDER_SITE_OTHER): Payer: Self-pay | Admitting: Nurse Practitioner

## 2024-10-08 DIAGNOSIS — A630 Anogenital (venereal) warts: Secondary | ICD-10-CM

## 2024-10-08 NOTE — Telephone Encounter (Signed)
 Copied from CRM #[id number]. Topic: Clinical - Medication Refill >> Oct 08, 2024 10:37 AM Vanessa G wrote: Medication: imiquimod  (ALDARA ) 5 % cream  Has the patient contacted their pharmacy? No, no refills on file (Agent: If no, request that the patient contact the pharmacy for the refill. If patient does not wish to contact the pharmacy document the reason why and proceed with request.) (Agent: If yes, when and what did the pharmacy advise?)  This is the patient's preferred pharmacy:  [facility] - [place], [place] - [address] [place] [address] Phone: [phone] Fax: [phone]  Is this the correct pharmacy for this prescription? Yes If no, delete pharmacy and type the correct one.   Has the prescription been filled recently? No  Is the patient out of the medication? Yes  Has the patient been seen for an appointment in the last year OR does the patient have an upcoming appointment? Yes  Can we respond through MyChart? Yes  Agent: Please be advised that Rx refills may take up to 3 business days. We ask that you follow-up with your pharmacy.

## 2024-10-10 ENCOUNTER — Encounter (INDEPENDENT_AMBULATORY_CARE_PROVIDER_SITE_OTHER): Payer: Self-pay

## 2024-10-10 ENCOUNTER — Ambulatory Visit (INDEPENDENT_AMBULATORY_CARE_PROVIDER_SITE_OTHER): Payer: MEDICAID | Admitting: Nurse Practitioner

## 2024-10-10 VITALS — BP 146/95 | HR 87 | Resp 18 | Ht 78.0 in | Wt 374.0 lb

## 2024-10-10 DIAGNOSIS — I890 Lymphedema, not elsewhere classified: Secondary | ICD-10-CM

## 2024-10-10 NOTE — Progress Notes (Signed)
 History of Present Illness  There is no documented history at this time  Assessments & Plan   There are no diagnoses linked to this encounter.    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    Procedure:  3 layer unna wrap was placed Right lower extremity.   Plan:   Follow up in one week.

## 2024-10-10 NOTE — Telephone Encounter (Signed)
 Requested medication (s) are due for refill today: yes  Requested medication (s) are on the active medication list: yes  Last refill:  07/13/24  Future visit scheduled: yes  Notes to clinic:  Medication not assigned to a protocol, review manually.      Requested Prescriptions  Pending Prescriptions Disp Refills   imiquimod  (ALDARA ) 5 % cream 12 each 0     Off-Protocol Failed - 10/10/2024 12:11 PM      Failed - Medication not assigned to a protocol, review manually.      Failed - Valid encounter within last 12 months    Recent Outpatient Visits           4 months ago Lymphedema   [facility] Franchot Isaiah LABOR, MD

## 2024-10-12 MED ORDER — IMIQUIMOD 5 % EX CREA: TOPICAL_CREAM | CUTANEOUS | 0 refills | Status: AC

## 2024-10-14 ENCOUNTER — Encounter (INDEPENDENT_AMBULATORY_CARE_PROVIDER_SITE_OTHER): Payer: Self-pay | Admitting: Nurse Practitioner

## 2024-10-17 ENCOUNTER — Encounter (INDEPENDENT_AMBULATORY_CARE_PROVIDER_SITE_OTHER): Payer: Self-pay | Admitting: Nurse Practitioner

## 2024-10-17 ENCOUNTER — Ambulatory Visit (INDEPENDENT_AMBULATORY_CARE_PROVIDER_SITE_OTHER): Payer: MEDICAID | Admitting: Nurse Practitioner

## 2024-10-17 VITALS — BP 151/95 | HR 82 | Resp 18

## 2024-10-17 DIAGNOSIS — I890 Lymphedema, not elsewhere classified: Secondary | ICD-10-CM | POA: Diagnosis not present

## 2024-10-17 NOTE — Progress Notes (Signed)
 History of Present Illness  There is no documented history at this time  Assessments & Plan   There are no diagnoses linked to this encounter.    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    Procedure:  3 layer unna wrap was placed Right lower extremity.   Plan:   Follow up in one week.

## 2024-10-21 ENCOUNTER — Encounter (INDEPENDENT_AMBULATORY_CARE_PROVIDER_SITE_OTHER): Payer: Self-pay | Admitting: Nurse Practitioner

## 2024-10-24 ENCOUNTER — Ambulatory Visit (INDEPENDENT_AMBULATORY_CARE_PROVIDER_SITE_OTHER): Payer: MEDICAID | Admitting: Nurse Practitioner

## 2024-10-24 ENCOUNTER — Encounter (INDEPENDENT_AMBULATORY_CARE_PROVIDER_SITE_OTHER): Payer: Self-pay | Admitting: Nurse Practitioner

## 2024-10-24 VITALS — BP 142/91 | HR 94 | Resp 18 | Ht 78.0 in | Wt 369.6 lb

## 2024-10-24 DIAGNOSIS — I890 Lymphedema, not elsewhere classified: Secondary | ICD-10-CM | POA: Diagnosis not present

## 2024-10-24 NOTE — Progress Notes (Signed)
 History of Present Illness  There is no documented history at this time  Assessments & Plan   There are no diagnoses linked to this encounter.    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    Procedure:  3 layer unna wrap was placed Right lower extremity.   Plan:   Follow up in one week.

## 2024-10-28 ENCOUNTER — Encounter (INDEPENDENT_AMBULATORY_CARE_PROVIDER_SITE_OTHER): Payer: Self-pay | Admitting: Nurse Practitioner

## 2024-10-30 ENCOUNTER — Ambulatory Visit: Payer: MEDICAID

## 2024-10-31 ENCOUNTER — Encounter (INDEPENDENT_AMBULATORY_CARE_PROVIDER_SITE_OTHER): Payer: MEDICAID

## 2024-10-31 ENCOUNTER — Ambulatory Visit (INDEPENDENT_AMBULATORY_CARE_PROVIDER_SITE_OTHER): Payer: MEDICAID | Admitting: Vascular Surgery

## 2024-11-02 ENCOUNTER — Ambulatory Visit (INDEPENDENT_AMBULATORY_CARE_PROVIDER_SITE_OTHER): Payer: MEDICAID | Admitting: Nurse Practitioner

## 2024-11-02 ENCOUNTER — Encounter (INDEPENDENT_AMBULATORY_CARE_PROVIDER_SITE_OTHER): Payer: Self-pay

## 2024-11-02 VITALS — BP 145/95 | HR 102 | Resp 17 | Ht 78.0 in | Wt 374.0 lb

## 2024-11-02 DIAGNOSIS — I890 Lymphedema, not elsewhere classified: Secondary | ICD-10-CM | POA: Diagnosis not present

## 2024-11-02 NOTE — Progress Notes (Unsigned)
 History of Present Illness  There is no documented history at this time  Assessments & Plan   There are no diagnoses linked to this encounter.    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    Procedure:  3 layer unna wrap was placed Right lower extremity.  3 layer clamine unna applied. Pt to start bp medication on tomorrow, and follow up in 1 wk

## 2024-11-04 ENCOUNTER — Encounter (INDEPENDENT_AMBULATORY_CARE_PROVIDER_SITE_OTHER): Payer: Self-pay | Admitting: Nurse Practitioner

## 2024-11-08 ENCOUNTER — Ambulatory Visit (INDEPENDENT_AMBULATORY_CARE_PROVIDER_SITE_OTHER): Payer: MEDICAID | Admitting: Nurse Practitioner

## 2024-11-08 ENCOUNTER — Encounter (INDEPENDENT_AMBULATORY_CARE_PROVIDER_SITE_OTHER): Payer: Self-pay | Admitting: Nurse Practitioner

## 2024-11-08 VITALS — BP 145/89 | HR 78 | Resp 18 | Ht 78.0 in | Wt 374.8 lb

## 2024-11-08 DIAGNOSIS — I890 Lymphedema, not elsewhere classified: Secondary | ICD-10-CM | POA: Diagnosis not present

## 2024-11-08 DIAGNOSIS — I10 Essential (primary) hypertension: Secondary | ICD-10-CM

## 2024-11-11 ENCOUNTER — Encounter (INDEPENDENT_AMBULATORY_CARE_PROVIDER_SITE_OTHER): Payer: Self-pay | Admitting: Nurse Practitioner

## 2024-11-11 NOTE — Progress Notes (Signed)
 "  Subjective:    Patient ID: Craig Boyer, male    DOB: 04/14/1986, 39 y.o.   MRN: [id number] Chief Complaint  Patient presents with   Follow-up    Follow up unna wrap     HPI  Discussed the use of AI scribe software for clinical note transcription with the patient, who gave verbal consent to proceed.  History of Present Illness Craig Boyer is a 39 year old male with chronic left lower extremity lymphedema following cellulitis who presents for ongoing management of leg swelling and Unna boot changes.  He has chronic left lower extremity lymphedema that began after an episode of cellulitis, which he identifies as the inciting event. The affected leg has previously been described as three times the size of the contralateral limb. He denies multiple wounds but notes the skin has a waxy residue from topical ointments.  He is currently undergoing weekly Unna boot changes and does not feel ready to discontinue this therapy, though he expresses a desire to eventually transition out of the boots.  He is attempting to improve his blood pressure and weight, recognizing his impact on his lymphedema. He is prescribed antihypertensive medication but has not been adherent, citing a prior episode of significant hypotension with clonidine during opioid rehabilitation approximately eighteen years ago, which has contributed to his reluctance to take blood pressure medications.    [employer] boot applied to lower extremity for lymphedema management. Old dressing removed, loose debris gently cleared. Skin with hard, flaky areas, no active wounds or drainage noted.   Review of Systems  Cardiovascular:  Positive for leg swelling.  All other systems reviewed and are negative.      Objective:   Physical Exam Vitals reviewed.  HENT:     Head: Normocephalic.  Cardiovascular:     Rate and Rhythm: Normal rate.  Pulmonary:     Effort: Pulmonary effort is normal.   Musculoskeletal:     Right lower leg: Edema present.     Left lower leg: Edema present.  Skin:    General: Skin is warm and dry.     Comments: Dry flaky skin  Neurological:     Mental Status: He is alert and oriented to person, place, and time.  Psychiatric:        Mood and Affect: Mood normal.        Behavior: Behavior normal.        Thought Content: Thought content normal.        Judgment: Judgment normal.     Physical Exam EXTREMITIES: Swelling reduced, skin hard and flaky on leg.  BP (!) 145/89 (BP Location: Right Arm, Patient Position: Sitting, Cuff Size: Large)   Pulse 78   Resp 18   Ht 6' 6 (1.981 m)   Wt (!) 374 lb 12.8 oz (170 kg)   BMI 43.31 kg/m   Past Medical History:  Diagnosis Date   Panic disorder    PTSD (post-traumatic stress disorder)    Social anxiety disorder    White coat syndrome without hypertension     Social History   Socioeconomic History   Marital status: Single    Spouse name: Not on file   Number of children: Not on file   Years of education: Not on file   Highest education level: Not on file  Occupational History   Not on file  Tobacco Use   Smoking status: Never   Smokeless tobacco: Never  Vaping Use  Vaping status: Never Used  Substance and Sexual Activity   Alcohol use: Yes    Comment: rare   Drug use: Never   Sexual activity: Yes  Other Topics Concern   Not on file  Social History Narrative   Not on file   Social Drivers of Health   Tobacco Use: Low Risk (11/11/2024)   Patient History    Smoking Tobacco Use: Never    Smokeless Tobacco Use: Never    Passive Exposure: Not on file  Financial Resource Strain: Not on file  Food Insecurity: Not on file  Transportation Needs: Not on file  Physical Activity: Not on file  Stress: Not on file  Social Connections: Not on file  Intimate Partner Violence: Not on file  Depression (PHQ2-9): Low Risk (06/12/2024)   Depression (PHQ2-9)    PHQ-2 Score: 2  Alcohol Screen:  Not on file  Housing: Not on file  Utilities: Not on file  Health Literacy: Low Risk (05/06/2023)   Received from [facility]   Health Literacy    : Never    Past Surgical History:  Procedure Laterality Date   pinky surgery     sinus fracture      Family History  Problem Relation Age of Onset   Throat cancer Father    Cancer Sister     Allergies[1]     Latest Ref Rng & Units 06/12/2024    3:26 PM 10/04/2023    3:37 PM  CBC  WBC 3.4 - 10.8 x10E3/uL 4.0  3.6   Hemoglobin 13.0 - 17.7 g/dL 86.1  85.5   Hematocrit 37.5 - 51.0 % 40.2  42.9   Platelets 150 - 450 x10E3/uL 108  114       CMP     Component Value Date/Time   NA 137 06/12/2024 1526   K 4.2 06/12/2024 1526   CL 103 06/12/2024 1526   CO2 22 06/12/2024 1526   GLUCOSE 106 (H) 06/12/2024 1526   BUN 6 06/12/2024 1526   CREATININE 0.77 06/12/2024 1526   CALCIUM 9.1 06/12/2024 1526   PROT 7.0 10/04/2023 1537   ALBUMIN 3.5 (L) 10/04/2023 1537   AST 37 10/04/2023 1537   ALT 13 10/04/2023 1537   ALKPHOS 162 (H) 10/04/2023 1537   BILITOT 1.0 10/04/2023 1537   EGFR 117 06/12/2024 1526     No results found.     Assessment & Plan:   1. Lymphedema (Primary) Lymphedema of lower extremity Chronic left lower extremity lymphedema improved post-cellulitis with reduced edema. Persistent indurated, hyperkeratotic skin. No active infection or new ulceration. Discussed blood pressure control and weight reduction as adjuncts. - Continued Unna boot therapy for four weeks with weekly changes. - Scheduled follow-up in four weeks to reassess and consider transition to compression stockings. - Provided education on gentle skin hygiene and avoidance of forcible removal of adherent material to prevent bleeding and cellulitis recurrence. - Emphasized adherence to antihypertensive therapy for optimal lymphedema control. - Arranged Unna boot reapplication prior to departure.  2. Primary hypertension Continue  antihypertensive medications as already ordered, these medications have been reviewed and there are no changes at this time.   Assessment and Plan Assessment & Plan      Medications Ordered Prior to Encounter[2]  There are no Patient Instructions on file for this visit. Return for Foot locker x4 .   Craig Hardrick E Mckinzie Saksa, NP      [1] No Known Allergies [2]  Current Outpatient Medications on File Prior to  Visit  Medication Sig Dispense Refill   alprazolam  (XANAX ) 2 MG tablet Take 1 tablet (2 mg total) by mouth 2 (two) times daily as needed. 14 tablet 0   imiquimod  (ALDARA ) 5 % cream APPLY TOPICALLY 3 TIMES A WEEK PRIOR TO BEDTIME. LEAVE ON SKIN FOR 6-10 HOURS. THEN REMOVE WITH MILD SOAP AND WATER 12 each 0   methadone (DOLOPHINE) 10 MG tablet Take 60 mg by mouth every 8 (eight) hours.     valsartan  (DIOVAN ) 40 MG tablet Take 1 tablet (40 mg total) by mouth daily. 90 tablet 3   triamcinolone  ointment (KENALOG ) 0.1 % Apply 1 Application topically 2 (two) times daily. 453 g 1   No current facility-administered medications on file prior to visit.   "

## 2024-11-14 ENCOUNTER — Encounter (INDEPENDENT_AMBULATORY_CARE_PROVIDER_SITE_OTHER): Payer: MEDICAID

## 2024-11-15 ENCOUNTER — Encounter (INDEPENDENT_AMBULATORY_CARE_PROVIDER_SITE_OTHER): Payer: MEDICAID

## 2024-11-16 ENCOUNTER — Encounter (INDEPENDENT_AMBULATORY_CARE_PROVIDER_SITE_OTHER): Payer: Self-pay

## 2024-11-16 ENCOUNTER — Encounter (INDEPENDENT_AMBULATORY_CARE_PROVIDER_SITE_OTHER): Payer: Self-pay | Admitting: Nurse Practitioner

## 2024-11-16 ENCOUNTER — Encounter (INDEPENDENT_AMBULATORY_CARE_PROVIDER_SITE_OTHER): Payer: MEDICAID

## 2024-11-16 ENCOUNTER — Ambulatory Visit (INDEPENDENT_AMBULATORY_CARE_PROVIDER_SITE_OTHER): Payer: MEDICAID | Admitting: Nurse Practitioner

## 2024-11-16 VITALS — BP 156/92 | HR 74 | Resp 18 | Ht 78.0 in

## 2024-11-16 DIAGNOSIS — I890 Lymphedema, not elsewhere classified: Secondary | ICD-10-CM | POA: Diagnosis not present

## 2024-11-16 NOTE — Progress Notes (Signed)
 History of Present Illness  There is no documented history at this time  Assessments & Plan   There are no diagnoses linked to this encounter.    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    Procedure:  3 layer unna wrap was placed Right lower extremity.   Plan:   Follow up in one week.

## 2024-11-21 ENCOUNTER — Encounter (INDEPENDENT_AMBULATORY_CARE_PROVIDER_SITE_OTHER): Payer: Self-pay

## 2024-11-21 ENCOUNTER — Ambulatory Visit (INDEPENDENT_AMBULATORY_CARE_PROVIDER_SITE_OTHER): Payer: MEDICAID | Admitting: Nurse Practitioner

## 2024-11-21 NOTE — Progress Notes (Unsigned)
 History of Present Illness  There is no documented history at this time  Assessments & Plan   There are no diagnoses linked to this encounter.    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    Procedure:  3 layer unna wrap was placed Right lower extremity.   Plan:   Follow up in one week.

## 2024-11-28 ENCOUNTER — Encounter (INDEPENDENT_AMBULATORY_CARE_PROVIDER_SITE_OTHER): Payer: MEDICAID

## 2024-12-05 ENCOUNTER — Encounter (INDEPENDENT_AMBULATORY_CARE_PROVIDER_SITE_OTHER): Payer: MEDICAID
